# Patient Record
Sex: Female | Born: 1980 | Race: White | Hispanic: No | Marital: Married | Smoking: Never smoker
Health system: Southern US, Community
[De-identification: ages and names within clinical notes are randomized; demographics above are authoritative.]

## PROBLEM LIST (undated history)

## (undated) DIAGNOSIS — J302 Other seasonal allergic rhinitis: Secondary | ICD-10-CM

## (undated) DIAGNOSIS — J189 Pneumonia, unspecified organism: Secondary | ICD-10-CM

## (undated) DIAGNOSIS — Z91048 Other nonmedicinal substance allergy status: Secondary | ICD-10-CM

## (undated) HISTORY — DX: Other nonmedicinal substance allergy status: Z91.048

## (undated) HISTORY — DX: Pneumonia, unspecified organism: J18.9

## (undated) HISTORY — DX: Other seasonal allergic rhinitis: J30.2

---
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
## 2001-05-09 ENCOUNTER — Encounter: Admission: RE | Admit: 2001-05-09 | Discharge: 2001-05-09 | Payer: Self-pay | Admitting: Family Medicine

## 2001-05-09 ENCOUNTER — Encounter: Payer: Self-pay | Admitting: Family Medicine

## 2003-04-25 ENCOUNTER — Encounter: Admission: RE | Admit: 2003-04-25 | Discharge: 2003-04-25 | Payer: Self-pay | Admitting: Family Medicine

## 2003-04-25 ENCOUNTER — Encounter: Payer: Self-pay | Admitting: Family Medicine

## 2003-10-17 ENCOUNTER — Ambulatory Visit (HOSPITAL_COMMUNITY): Admission: RE | Admit: 2003-10-17 | Discharge: 2003-10-17 | Payer: Self-pay | Admitting: Obstetrics and Gynecology

## 2003-11-20 ENCOUNTER — Ambulatory Visit (HOSPITAL_COMMUNITY): Admission: RE | Admit: 2003-11-20 | Discharge: 2003-11-20 | Payer: Self-pay | Admitting: Obstetrics and Gynecology

## 2004-09-15 ENCOUNTER — Encounter (INDEPENDENT_AMBULATORY_CARE_PROVIDER_SITE_OTHER): Payer: Self-pay | Admitting: Specialist

## 2004-09-15 ENCOUNTER — Inpatient Hospital Stay (HOSPITAL_COMMUNITY): Admission: AD | Admit: 2004-09-15 | Discharge: 2004-09-17 | Payer: Self-pay | Admitting: Obstetrics and Gynecology

## 2006-11-05 ENCOUNTER — Inpatient Hospital Stay (HOSPITAL_COMMUNITY): Admission: AD | Admit: 2006-11-05 | Discharge: 2006-11-05 | Payer: Self-pay | Admitting: Obstetrics and Gynecology

## 2007-05-16 ENCOUNTER — Inpatient Hospital Stay (HOSPITAL_COMMUNITY): Admission: AD | Admit: 2007-05-16 | Discharge: 2007-05-18 | Payer: Self-pay | Admitting: Obstetrics and Gynecology

## 2007-12-14 ENCOUNTER — Encounter: Admission: RE | Admit: 2007-12-14 | Discharge: 2007-12-14 | Payer: Self-pay | Admitting: Gastroenterology

## 2010-12-08 NOTE — Discharge Summary (Signed)
NAMEDONNAMAE, MUILENBURG           ACCOUNT NO.:  [id number]   MEDICAL RECORD NO.:  [id number]          PATIENT TYPE:  INP   LOCATION:  9126                          FACILITY:  [facility]   PHYSICIAN:  Malachi Pro. Ambrose Mantle, M.D. DATE OF BIRTH:  03-Jun-1981   DATE OF ADMISSION:  05/16/2007  DATE OF DISCHARGE:  05/18/2007                               DISCHARGE SUMMARY   HOSPITAL COURSE:  A 30 year old, white, married female, P1-0-1-1, G51,  [facility] May 27, 2007, by 9-week ultrasound presented to labor and  delivery with complaints of contractions every 5 minutes with the cervix  4+ centimeters dilated.  The patient was uncomfortable with  contractions, so she was admitted and received an epidural.  There was  no leakage of fluid.   PRENATAL COURSE:  Prenatal care was uncomplicated except for  subchorionic hemorrhage in the first trimester.   PRENATAL LABORATORY DATA:  Blood group and type O positive, negative  antibody, RPR nonreactive, rubella immune, hepatitis B surface antigen  negative, HIV negative, GC and chlamydia negative.  Group B  Streptococcus negative.  One-hour Glucola was 107.   PAST OBSTETRIC HISTORY:  In 2004, she had a spontaneous abortion.  In  2006, a normal spontaneous delivery of a 7 pound 14 ounce infant.   ALLERGIES:  VICODIN.   MEDICATIONS:  Prenatal vitamins.   PAST GYNECOLOGICAL HISTORY:  No abnormal Pap smears.   PAST SURGICAL HISTORY:  1. Tonsillectomy.  2. Wisdom teeth extraction.   PAST MEDICAL HISTORY:  Negative.   PHYSICAL EXAMINATION:  VITAL SIGNS:  Afebrile with normal vital signs.  HEART/LUNGS:  Normal.  ABDOMEN:  Soft and nontender.  Estimated fetal weight was 8 to 8-1/2  pounds.  PELVIC:  Cervix was 4-5 cm, 50-60% effaced, vertex at a -2.   HOSPITAL COURSE:  Dr. Senaida Ores performed artificial rupture of  membranes.  There was some trouble tracing the contractions, so she  placed an intrauterine pressure catheter.  At 8:35 a.m., she was 5 cm,  70%.  By 10:30 a.m., she was 6 cm, 60%.  She progressed to complete  dilatation and pushed well and had a spontaneous vaginal delivery of a  living female infant, 8 pounds 1 ounce, with Apgar's of 8 and 9 at one  and five minutes.  There was a nuchal cord x1 and it was reduced.  There  was also a true knot in the cord.  Placenta was spontaneous and intact.  A first-degree laceration was repaired with 3-0 Vicryl by Dr. Jackelyn Knife.  Small labial abrasions were hemostatic and not repaired.  Blood loss was  less than 500 mL.  Postpartum, the patient did well and was discharged  on the second postpartum day.  RPR was nonreactive.  Hemoglobin on  admission was 12.8, hematocrit 37, white count 10,100, platelet count  216,000.   DISCHARGE DIAGNOSIS:  Intrauterine pregnancy at 38 plus weeks, delivered  vertex.   PROCEDURE:  Spontaneous delivery, vertex with repair of first-degree  laceration.   CONDITION ON DISCHARGE:  Improved.   SPECIAL INSTRUCTIONS:  Instructions include our regular discharge  instruction booklet.   FOLLOW UP:  The  patient is advised to return to the office in 6 weeks  for followup examination.   DISCHARGE MEDICATION:  Darvocet-N 100 20 tablets one every 4-6 hours as  needed for pain is given at discharge.      Malachi Pro. Ambrose Mantle, M.D.  Electronically Signed     TFH/MEDQ  D:  05/18/2007  T:  05/19/2007  Job:  [id number]

## 2010-12-11 NOTE — Discharge Summary (Signed)
Susan Brock, Susan Brock           ACCOUNT NO.:  [id number]   MEDICAL RECORD NO.:  [id number]          PATIENT TYPE:  INP   LOCATION:  9127                          FACILITY:  [facility]   PHYSICIAN:  Malachi Pro. Ambrose Mantle, M.D. DATE OF BIRTH:  September 19, 1980   DATE OF ADMISSION:  09/15/2004  DATE OF DISCHARGE:                                 DISCHARGE SUMMARY   This 30 year old white married female, para 0-0-1-0, gravida 2, ECD/September 21, 2004, admitted for induction of labor.  Blood group and type O positive  with a negative antibody, nonreactive serology, rubella immune, hepatitis B  surface antigen negative, HIV declined.  GC and chlamydia negative.  Triple  screen declined.  One-hour Glucola 109, group B strep negative.   Vaginal ultrasound on February 16, 2004 showed a crown/rump length of 2 cm, 8  weeks 4 days, EDC/September 20, 2004.  Repeat ultrasound on April 22, 2004.  Average gestational age [redacted] weeks and 3 days, EDC/September 20, 2004.  The patient was treated for sinusitis on June 08, 2004 with  Augmentin.  Prenatal care otherwise was uncomplicated.  She was admitted for induction  of labor.   PAST MEDICAL HISTORY:  No known drug allergies.  No illnesses.   OPERATIONS:  T&A in 1989.  Wisdom teeth extracted in 1998.  No close family  history of significance except bother had closure of fontanel at birth and  had to be re-opened.   OBSTETRIC HISTORY:  In 2004, spontaneous abortion.   PHYSICAL EXAMINATION:  Physical exam on admission revealed normal vital  signs.  Heart and lungs were normal.  Abdomen soft.  Fundal height had been  37 cm on September 08, 2004.  Cervix was loose fingertip, 25% vertex at a -2  to -3.  Artificial rupture of the membranes produced clear fluid.  Pitocin  was begun.  By 12:55 p.m., the Pitocin was at 15 milliunits a minute, and  contractions were every two to three minutes.  Cervix was 3 cm, 60%.  At  5:15 p.m, Pitocin was at 18 milliunits a minute,  contractions every two to  four minutes.  The patient had received an epidural, and her cervix was 4+  cm, 90% vertex at a -1.  There was bloody show.  She progressed to full  dilatation and pushed well.  She delivered spontaneously OA over a midline  vaginal and bilateral labial lacerations by Dr. Ambrose Mantle, a living, female  infant 7 pounds 14 ounces.  Apgars of 8 at 1 and 9 at 5 minutes.  There was  one loop of nuchal cord.  The baby's temperature was greater than 101.  Placenta was intact.  The rectal was negative.  Vaginal laceration repaired  with 2-0 Vicryl.  Exposure was somewhat difficult because the perineum was  intact.  Right labial laceration repaired with 3-0 Vicryl.  Left labial  laceration hemostatic and not repaired.  Suturing of the right labial  laceration caused slight distortion.  The labia was torn away from the  clitoris.  Blood loss about 400 mL.  Postpartum, the patient did well and  was ready for  discharge on the second postpartum day.   LABORATORY DATA:  Initial hemoglobin 12.8, hematocrit 37.3, white count  9800.  Followup hemoglobin 12.1, hematocrit 35.6, white count 17,800.  Platelet count 206,000.  RPR nonreactive.   FINAL DIAGNOSES:  Intrauterine pregnancy at 39 weeks, delivered OA.   OPERATION:  Spontaneous delivery OA.  Repair of midline vaginal and right  labial laceration.   FINAL CONDITION:  Improved.   INSTRUCTIONS:  Regular discharge instruction booklet.  Darvocet-N 100 24  tablets, 1 every 4-6 hours as needed for pain.  The patient is advised to  return in six weeks for followup examination.      TFH/MEDQ  D:  09/17/2004  T:  09/17/2004  Job:  [id number]

## 2011-05-05 LAB — CBC
HCT: 37
MCHC: 34.7
MCV: 86.1
RBC: 4.29
WBC: 10.1

## 2012-07-26 DIAGNOSIS — J189 Pneumonia, unspecified organism: Secondary | ICD-10-CM

## 2012-07-26 HISTORY — DX: Pneumonia, unspecified organism: J18.9

## 2014-08-08 ENCOUNTER — Other Ambulatory Visit (INDEPENDENT_AMBULATORY_CARE_PROVIDER_SITE_OTHER): Payer: 59

## 2014-08-08 ENCOUNTER — Encounter: Payer: Self-pay | Admitting: Internal Medicine

## 2014-08-08 ENCOUNTER — Ambulatory Visit (INDEPENDENT_AMBULATORY_CARE_PROVIDER_SITE_OTHER): Payer: 59 | Admitting: Internal Medicine

## 2014-08-08 ENCOUNTER — Ambulatory Visit (INDEPENDENT_AMBULATORY_CARE_PROVIDER_SITE_OTHER)
Admission: RE | Admit: 2014-08-08 | Discharge: 2014-08-08 | Disposition: A | Discharge status: Home / Self Care | Payer: 59 | Source: Ambulatory Visit | Attending: Internal Medicine | Admitting: Internal Medicine

## 2014-08-08 VITALS — BP 122/86 | HR 92 | Temp 98.6°F | Ht 63.0 in | Wt 227.8 lb

## 2014-08-08 DIAGNOSIS — R058 Other specified cough: Secondary | ICD-10-CM

## 2014-08-08 DIAGNOSIS — R05 Cough: Secondary | ICD-10-CM

## 2014-08-08 DIAGNOSIS — J45991 Cough variant asthma: Secondary | ICD-10-CM | POA: Insufficient documentation

## 2014-08-08 DIAGNOSIS — J189 Pneumonia, unspecified organism: Secondary | ICD-10-CM

## 2014-08-08 LAB — CBC WITH DIFFERENTIAL/PLATELET
BASOS PCT: 0.4 % (ref 0.0–3.0)
Basophils Absolute: 0 10*3/uL (ref 0.0–0.1)
EOS ABS: 0.1 10*3/uL (ref 0.0–0.7)
Eosinophils Relative: 0.7 % (ref 0.0–5.0)
HCT: 43.9 % (ref 36.0–46.0)
Hemoglobin: 14.6 g/dL (ref 12.0–15.0)
LYMPHS PCT: 22.2 % (ref 12.0–46.0)
Lymphs Abs: 2.4 10*3/uL (ref 0.7–4.0)
MCHC: 33.3 g/dL (ref 30.0–36.0)
MCV: 86.4 fl (ref 78.0–100.0)
Monocytes Absolute: 0.4 10*3/uL (ref 0.1–1.0)
Monocytes Relative: 3.6 % (ref 3.0–12.0)
NEUTROS ABS: 7.8 10*3/uL — AB (ref 1.4–7.7)
Neutrophils Relative %: 73.1 % (ref 43.0–77.0)
PLATELETS: 280 10*3/uL (ref 150.0–400.0)
RBC: 5.08 Mil/uL (ref 3.87–5.11)
RDW: 14.2 % (ref 11.5–15.5)
WBC: 10.7 10*3/uL — AB (ref 4.0–10.5)

## 2014-08-08 MED ORDER — PREDNISONE 10 MG PO TABS: ORAL_TABLET | ORAL | Status: DC

## 2014-08-08 MED ORDER — PANTOPRAZOLE SODIUM 40 MG PO TBEC: 40.0000 mg | DELAYED_RELEASE_TABLET | Freq: Every day | ORAL | Status: DC

## 2014-08-08 MED ORDER — TRAMADOL HCL 50 MG PO TABS: ORAL_TABLET | ORAL | Status: DC

## 2014-08-08 MED ORDER — FAMOTIDINE 20 MG PO TABS: ORAL_TABLET | ORAL | Status: DC

## 2014-08-08 NOTE — Assessment & Plan Note (Signed)
The most common causes of chronic cough in immunocompetent adults include the following: upper airway cough syndrome (UACS), previously referred to as postnasal drip syndrome (PNDS), which is caused by variety of rhinosinus conditions; (2) asthma; (3) GERD; (4) chronic bronchitis from cigarette smoking or other inhaled environmental irritants; (5) nonasthmatic eosinophilic bronchitis; and (6) bronchiectasis.   These conditions, singly or in combination, have accounted for up to 94% of the causes of chronic cough in prospective studies.   Other conditions have constituted no >6% of the causes in prospective studies These have included bronchogenic carcinoma, chronic interstitial pneumonia, sarcoidosis, left ventricular failure, ACEI-induced cough, and aspiration from a condition associated with pharyngeal dysfunction.    Chronic cough is often simultaneously caused by more than one condition. A single cause has been found from 38 to 82% of the time, multiple causes from 18 to 62%. Multiply caused cough has been the result of three diseases up to 42% of the time.       Based on hx and exam, this is most likely:  Classic Upper airway cough syndrome, so named because it's frequently impossible to sort out how much is  CR/sinusitis with freq throat clearing (which can be related to primary GERD)   vs  causing  secondary (" extra esophageal")  GERD from wide swings in gastric pressure that occur with throat clearing, often  promoting self use of mint and menthol lozenges that reduce the lower esophageal sphincter tone and exacerbate the problem further in a cyclical fashion.   These are the same pts (now being labeled as having "irritable larynx syndrome" by some cough centers) who not infrequently have a history of having failed to tolerate ace inhibitors,  dry powder inhalers or biphosphonates or report having atypical reflux symptoms that don't respond to standard doses of PPI , and are easily confused as  having aecopd or asthma flares by even experienced allergists/ pulmonologists.   The first step is to maximize acid suppression and eliminate cyclical coughing then regroup if the cough persists.  See instructions for specific recommendations which were reviewed directly with the patient who was given a copy with highlighter outlining the key components.    Will rx as sinusitis/gerd then regroup in 2 weeks

## 2014-08-08 NOTE — Assessment & Plan Note (Signed)
Not sure this is really cap or ? Could she be developing the equivalent of a RML syndrome on the Left or atypical TB / bronchiectasis involving lingula   rx with augmentin as may also have sinus dz and f/u in 2 weeks

## 2014-08-08 NOTE — Patient Instructions (Addendum)
Augmentin 875 mg take one pill twice daily  X 10 days - take at breakfast and supper with large glass of water.  It would help reduce the usual side effects (diarrhea and yeast infections) if you ate cultured yogurt at lunch.   Please see patient coordinator before you leave today  to schedule sinus ct  The key to effective treatment for your cough is eliminating the non-stop cycle of cough you're stuck in long enough to let your airway heal completely and then see if there is anything still making you cough once you stop the cough suppression, but this should take no more than 5 days to figure out  First take delsym two tsp every 12 hours and supplement if needed with  tramadol 50 mg up to 2 every 4 hours to suppress the urge to cough at all or even clear your throat. Swallowing water or using ice chips/non mint and menthol containing candies (such as lifesavers or sugarless jolly ranchers) are also effective.  You should rest your voice and avoid activities that you know make you cough.  Once you have eliminated the cough for 3 straight days try reducing the tramadol first,  then the delsym as tolerated.    Prednisone 10 mg take  4 each am x 2 days,   2 each am x 2 days,  1 each am x 2 days and stop (this is to eliminate allergies and inflammation from coughing)  Protonix (pantoprazole) Take 30-60 min before first meal of the day and Pepcid 20 mg one bedtime plus chlorpheniramine 4 mg x 2 at bedtime (both available over the counter)  until cough is completely gone for at least a week without the need for cough suppression  GERD (REFLUX)  is an extremely common cause of respiratory symptoms, many times with no significant heartburn at all.    It can be treated with medication, but also with lifestyle changes including avoidance of late meals, excessive alcohol, smoking cessation, and avoid fatty foods, chocolate, peppermint, colas, red wine, and acidic juices such as orange juice.  NO MINT OR  MENTHOL PRODUCTS SO NO COUGH DROPS  USE HARD CANDY INSTEAD (jolley ranchers or Stover's or Lifesavers (all available in sugarless versions) NO OIL BASED VITAMINS - use powdered substitutes.  Please remember to go to the lab and x-ray department downstairs for your tests - we will call you with the results when they are available.     If not better next week call for follow up appt Late add f/u 2 weeks with cxr

## 2014-08-08 NOTE — Progress Notes (Signed)
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 Pulmonary Consult    Referred by Dr. Josie SaundersAsres. Pt c/o cough for the past 2 months- prod at times with yellow to green sputum. She also c/o DOE with walking up stairs. She had PNA Nov 2014.   Multiple abx and steroids since abrupt  onset 2 x months never improved but  better while sleeping then after stirs in am much worse within 30 min regardless of activity  Assoc with urinary incont, sob mostly with cough  No obvious other patterns in day to day or daytime variabilty or assoc  cp or chest tightness, subjective wheeze   hb symptoms. No unusual exp hx or h/o childhood pna/ asthma or knowledge of premature birth.  Sleeping ok without nocturnal  or early am exacerbation  of respiratory  c/o's or need for noct saba. Also denies any obvious fluctuation of symptoms with weather or environmental changes or other aggravating or alleviating factors except as outlined above   Current Medications, Allergies, Complete Past Medical History, Past Surgical History, Family History, and Social History were reviewed in [facility] electronic medical record.             Review of Systems  Constitutional: Negative for fever, chills and unexpected weight change.  HENT: Positive for congestion and ear pain. Negative for dental problem, nosebleeds, postnasal drip, rhinorrhea, sinus pressure, sneezing, sore throat, trouble swallowing and voice change.   Eyes: Negative for visual disturbance.    Respiratory: Positive for cough and shortness of breath. Negative for choking.   Cardiovascular: Negative for chest pain and leg swelling.  Gastrointestinal: Negative for vomiting, abdominal pain and diarrhea.  Genitourinary: Negative for difficulty urinating.  Musculoskeletal: Negative for arthralgias.  Skin: Negative for rash.  Neurological: Negative for tremors, syncope and headaches.  Hematological: Does not bruise/bleed easily.       Objective:   Physical Exam amb wf honking /junky cough  Wt Readings from Last 3 Encounters:  08/08/14 227 lb 12.8 oz (103.329 kg)    Vital signs reviewed  HEENT: nl dentition, turbinates, and orophanx. Nl external ear canals without cough reflex   NECK :  without JVD/Nodes/TM/ nl carotid upstrokes bilaterally   LUNGS: no acc muscle use, clear to A and P bilaterally without cough on exp maneuvers   CV:  RRR  no s3 or murmur or increase in P2, no edema   ABD:  soft and nontender with nl excursion in the supine position. No bruits or organomegaly, bowel sounds nl  MS:  warm without deformities, calf tenderness, cyanosis or clubbing  SKIN: warm and dry without lesions    NEURO:  alert, approp, no deficits    CXR PA and Lateral:   08/08/2014 :     I personally reviewed images and agree (though the finding is very unimpressive to me)  with radiology impression as follows:   Patchy increased density in the lingula is consistent with  atelectasis or early interstitial infiltrate. Follow-up imaging following anticipated antibiotic therapy is recommended to assure Clearing.  Labs ordered Allergy profile  No eos Lab Results  Component Value Date   WBC 10.7* 08/08/2014   HGB 14.6 08/08/2014   HCT 43.9 08/08/2014   MCV 86.4 08/08/2014   PLT 280.0 08/08/2014            Assessment & Plan:

## 2014-08-09 ENCOUNTER — Telehealth: Payer: Self-pay | Admitting: Internal Medicine

## 2014-08-09 LAB — ALLERGY FULL PROFILE
Alternaria Alternata: <0.1 kU/L
Aspergillus fumigatus, m3: <0.1 kU/L
Bahia Grass: <0.1 kU/L
Box Elder IgE: <0.1 kU/L
Cat Dander: <0.1 kU/L
Elm IgE: <0.1 kU/L
Fescue: <0.1 kU/L
G005 Rye, Perennial: <0.1 kU/L
G009 Red Top: <0.1 kU/L
Goldenrod: <0.1 kU/L
House Dust Hollister: <0.1 kU/L
IGE (IMMUNOGLOBULIN E), SERUM: 33 kU/L (ref <115)
Plantain: <0.1 kU/L
Stemphylium Botryosum: <0.1 kU/L

## 2014-08-09 NOTE — Telephone Encounter (Signed)
Pt called back and is aware that [facility]. Nothing more needed at this time.

## 2014-08-09 NOTE — Progress Notes (Signed)
Quick Note:  Spoke with pt and notified of results per Dr. Wert. Pt verbalized understanding and denied any questions.  ______ 

## 2014-08-09 NOTE — Telephone Encounter (Signed)
Rx called to pharm for augmentin  ATC the pt, NA and no option to leave msg

## 2014-08-15 ENCOUNTER — Ambulatory Visit (INDEPENDENT_AMBULATORY_CARE_PROVIDER_SITE_OTHER)
Admission: RE | Admit: 2014-08-15 | Discharge: 2014-08-15 | Disposition: A | Discharge status: Home / Self Care | Payer: 59 | Source: Ambulatory Visit | Attending: Internal Medicine | Admitting: Internal Medicine

## 2014-08-15 ENCOUNTER — Other Ambulatory Visit: Payer: Self-pay | Admitting: Internal Medicine

## 2014-08-15 DIAGNOSIS — R058 Other specified cough: Secondary | ICD-10-CM

## 2014-08-15 DIAGNOSIS — R05 Cough: Secondary | ICD-10-CM

## 2014-08-15 MED ORDER — AMOXICILLIN-POT CLAVULANATE 875-125 MG PO TABS: 1.0000 | ORAL_TABLET | Freq: Two times a day (BID) | ORAL | Status: DC

## 2014-08-15 NOTE — Progress Notes (Signed)
Quick Note:  Spoke with pt and notified of results per Dr. Wert. Pt verbalized understanding and denied any questions.  ______ 

## 2014-08-23 ENCOUNTER — Ambulatory Visit (INDEPENDENT_AMBULATORY_CARE_PROVIDER_SITE_OTHER): Payer: 59 | Admitting: Internal Medicine

## 2014-08-23 ENCOUNTER — Encounter: Payer: Self-pay | Admitting: Internal Medicine

## 2014-08-23 ENCOUNTER — Ambulatory Visit (INDEPENDENT_AMBULATORY_CARE_PROVIDER_SITE_OTHER)
Admission: RE | Admit: 2014-08-23 | Discharge: 2014-08-23 | Disposition: A | Discharge status: Home / Self Care | Payer: 59 | Source: Ambulatory Visit | Attending: Internal Medicine | Admitting: Internal Medicine

## 2014-08-23 VITALS — BP 114/80 | HR 86 | Temp 98.1°F | Ht 63.0 in | Wt 226.0 lb

## 2014-08-23 DIAGNOSIS — J189 Pneumonia, unspecified organism: Secondary | ICD-10-CM

## 2014-08-23 DIAGNOSIS — Z720 Tobacco use: Secondary | ICD-10-CM

## 2014-08-23 DIAGNOSIS — R058 Other specified cough: Secondary | ICD-10-CM

## 2014-08-23 DIAGNOSIS — F17210 Nicotine dependence, cigarettes, uncomplicated: Secondary | ICD-10-CM

## 2014-08-23 DIAGNOSIS — R05 Cough: Secondary | ICD-10-CM

## 2014-08-23 MED ORDER — FLUTTER DEVI: Status: AC

## 2014-08-23 MED ORDER — MOMETASONE FURO-FORMOTEROL FUM 100-5 MCG/ACT IN AERO: INHALATION_SPRAY | RESPIRATORY_TRACT | Status: DC

## 2014-08-23 NOTE — Progress Notes (Signed)
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 Pulmonary Consult    Referred by Dr. Josie SaundersAsres. Pt c/o cough for the past 2 months- prod at times with yellow to green sputum. She also c/o DOE with walking up stairs. She had PNA Nov 2014.   Multiple abx and steroids since abrupt  onset 2 x months never improved but  better while sleeping then after stirs in am much worse within 30 min regardless of activity  Assoc with urinary incont, sob mostly with cough rec Augmentin 875 mg take one pill twice daily  X 10 days - take at breakfast and supper with large glass of water.  It would help reduce the usual side effects (diarrhea and yeast infections) if you ate cultured yogurt at lunch.  Please see patient coordinator before you leave today  to schedule sinus ct> sphenoid sinusitis/ bubbly secrections > extended augmentinto 20 days rx First take delsym two tsp every 12 hours and supplement if needed with  tramadol 50 mg up to 2 every 4 hours     Prednisone 10 mg take  4 each am x 2 days,   2 each am x 2 days,  1 each am x 2 days and stop (this is to eliminate allergies and inflammation from coughing) Protonix (pantoprazole) Take 30-60 min before first meal of the day and Pepcid 20 mg one bedtime plus chlorpheniramine 4 mg x 2 at bedtime (both available over the counter)  until cough is completely gone for at least a week without the need for cough suppression GERD ( diet   08/23/2014 f/u  ov/Alany Borman re:  Chief Complaint  Patient presents with  . Follow-up    CXR done today. Cough has improved some. Still has some occ light green sputum. Her chest still feels tight at times and breathing not completely back to baseline yet. She has not used rescue inhaler in the past wk.    Most green mucus is in am/ has one more week of augmentin  Not using ventolin this week   No obvious day to day or daytime variabilty or assoc sob  or cp or chest tightness, subjective wheeze overt sinus or hb symptoms. No unusual exp hx or h/o childhood pna/ asthma or knowledge of premature birth.  Sleeping ok without nocturnal  or early am exacerbation  of respiratory  c/o's or need for noct saba. Also denies any obvious fluctuation of symptoms with weather or environmental changes or other aggravating or alleviating factors except as outlined above   Current Medications, Allergies, Complete Past Medical History, Past Surgical History, Family History, and Social History were reviewed in [facility] electronic medical record.  ROS  The following are not active complaints unless bolded sore throat, dysphagia, dental problems, itching, sneezing,  nasal congestion or excess/ purulent secretions, ear ache,   fever, chills, sweats, unintended wt loss, pleuritic or exertional cp,  hemoptysis,  orthopnea pnd or leg swelling, presyncope, palpitations, heartburn, abdominal pain, anorexia, nausea, vomiting, diarrhea  or change in bowel or urinary habits, change in stools or urine, dysuria,hematuria,  rash, arthralgias, visual complaints, headache, numbness weakness or ataxia or problems with walking or coordination,  change in mood/affect or memory.                         Objective:   Physical Exam  amb wf with grinding end exp cough but not longer honking    Vital signs reviewed  HEENT: nl dentition, turbinates, and orophanx. Nl external ear canals without cough reflex   NECK :  without JVD/Nodes/TM/ nl  carotid upstrokes bilaterally   LUNGS: no acc muscle use, clear to A and P bilaterally without cough on exp maneuvers   CV:  RRR  no s3 or murmur or increase in P2, no edema   ABD:  soft and nontender with nl excursion in the supine position. No bruits or organomegaly, bowel sounds nl  MS:  warm without deformities, calf tenderness, cyanosis or clubbing  SKIN: warm and dry without lesions    NEURO:  alert, approp, no deficits     Labs ordered Allergy profile  No eos Lab Results  Component Value Date   WBC 10.7* 08/08/2014   HGB 14.6 08/08/2014   HCT 43.9 08/08/2014   MCV 86.4 08/08/2014   PLT 280.0 08/08/2014     CXR:  08/23/14 I personally reviewed images and agree with radiology impression as follows:   There is stable mildly increased density in the lingula which may reflect persistent infiltrate, atelectasis, or scarring.     Assessment & Plan:

## 2014-08-23 NOTE — Patient Instructions (Addendum)
dulera 100 Take 2 puffs first thing in am and then another 2 puffs about 12 hours later.   Only use your albuterol as a rescue medication to be used if you can't catch your breath by resting or doing a relaxed purse lip breathing pattern.  - The less you use it, the better it will work when you need it. - Ok to use up to 2 puffs  every 4 hours if you must but call for immediate appointment if use goes up over your usual need - Don't leave home without it !!  (think of it like the spare tire for your car)   Finish antibiotics   Check with your insurance formulary re best option for protonix alternative   Use the flutter valve as much as you can   Please schedule a follow up office visit in 4 weeks, sooner if needed Late add cxr on return

## 2014-08-25 ENCOUNTER — Encounter: Payer: Self-pay | Admitting: Internal Medicine

## 2014-08-25 NOTE — Assessment & Plan Note (Signed)
?   Developing lingula syndrome/ bronchiectasis on L > recheck cxr in 4 weeks then CT next

## 2014-08-25 NOTE — Assessment & Plan Note (Signed)
.>  3 mi  I took an extended  opportunity with this patient to outline the consequences of continued cigarette use  in airway disorders based on all the data we have from the multiple national lung health studies (perfomed over decades at millions of dollars in cost)  indicating that smoking cessation, not choice of inhalers or physicians, is the most important aspect of care.    Not ready to commit to quit yet

## 2014-08-25 NOTE — Assessment & Plan Note (Signed)
Still has very harsh/ grinding pattern cough typical of an upper airway source which is highly dysfunctional/ traumatizing to the airway  rec Add the flutter valve Stop smoking Finish abx  May need ct sinus and chest if still coughing

## 2014-09-03 ENCOUNTER — Telehealth: Payer: Self-pay | Admitting: Internal Medicine

## 2014-09-03 NOTE — Telephone Encounter (Signed)
Spoke with pt, states she finished Augmentin last Friday, woke up yesterday with sore throat, pnd.  Jamal Maesried to use a humidifier last night, did not help.  Woke up this morning with the same symptoms.  Requesting recs.  Uses [facility] in [place].    MW please advise.  Thank you.

## 2014-09-03 NOTE — Telephone Encounter (Signed)
Be sure still taking ppi qam ac and pepcid 20 qhs and   For drainage take chlortrimeton (chlorpheniramine) 4 mg every 4 hours available over the counter (may cause drowsiness) and if not better will need to repeat sinus CT by end of week rather than just keep piling on abx as risk resistant bugs/ c diff etc

## 2014-09-03 NOTE — Telephone Encounter (Signed)
Pt aware of rec's per MW -  Aware to call us and let us know if no better by the end of week.  Nothing further needed.

## 2014-09-09 ENCOUNTER — Telehealth: Payer: Self-pay | Admitting: Internal Medicine

## 2014-09-09 MED ORDER — AMOXICILLIN-POT CLAVULANATE 875-125 MG PO TABS: 1.0000 | ORAL_TABLET | Freq: Two times a day (BID) | ORAL | Status: DC

## 2014-09-09 NOTE — Telephone Encounter (Signed)
Spoke with pt.  She called last wk with c/o PND and sore throat and was given following recs:  Nyoka CowdenMichael B Wert, MD at 09/03/2014 12:28 PM     Status: Signed       Expand All Collapse All   Be sure still taking ppi qam ac and pepcid 20 qhs and For drainage take chlortrimeton (chlorpheniramine) 4 mg every 4 hours available over the counter (may cause drowsiness) and if not better will need to repeat sinus CT by end of week rather than just keep piling on abx as risk resistant bugs/ c diff etc        -----  Pt states she continues to take ppi qam and pepcid 20 mg qhs along with chlortrimeton  4mg  q4h.  Still has sore throat and PND.  Cough started increasing over the wkend.  Occas prod with small amount of green tinted mucus.  Requesting further recs - MW, would you like to proceed with CT Sinus?

## 2014-09-09 NOTE — Telephone Encounter (Signed)
Called, spoke with pt -  Discussed below per MW.  She verbalized understanding and is aware rx [facility] [place]. Pt has appt already scheduled with MW on Feb 29 at 9:15 am.  Pt would like to keep this appt and is to call back if symptoms worsen prior to this.  She verbalized understanding, is in agreement with plan, and voiced no further questions or concerns at this time.

## 2014-09-09 NOTE — Telephone Encounter (Signed)
Let's just do Augmentin 875 mg take one pill twice daily  X 10 days - take at breakfast and supper with large glass of water.  It would help reduce the usual side effects (diarrhea and yeast infections) if you ate cultured yogurt at lunch then return here at end of rx to regroup with all meds in hand

## 2014-09-23 ENCOUNTER — Encounter: Payer: Self-pay | Admitting: Internal Medicine

## 2014-09-23 ENCOUNTER — Ambulatory Visit (INDEPENDENT_AMBULATORY_CARE_PROVIDER_SITE_OTHER): Payer: 59 | Admitting: Internal Medicine

## 2014-09-23 VITALS — BP 122/90 | HR 99 | Ht 63.0 in | Wt 228.0 lb

## 2014-09-23 DIAGNOSIS — R058 Other specified cough: Secondary | ICD-10-CM

## 2014-09-23 DIAGNOSIS — R05 Cough: Secondary | ICD-10-CM

## 2014-09-23 DIAGNOSIS — J45991 Cough variant asthma: Secondary | ICD-10-CM

## 2014-09-23 MED ORDER — MOMETASONE FURO-FORMOTEROL FUM 100-5 MCG/ACT IN AERO: INHALATION_SPRAY | RESPIRATORY_TRACT | Status: DC

## 2014-09-23 NOTE — Patient Instructions (Addendum)
Please see patient coordinator before you leave today  to schedule sinus CT and we will arrange referral to Dr Jenne PaneBates if needed  Continue PPI in the form of prilosec otc 20 mg Take 30- 60 min before your first and last meals of the day until 100% better, then try   Continue dulera 100 Take 2 puffs first thing in am and then another 2 puffs about 12 hours later.   Please schedule a follow up office visit in 6 weeks, call sooner if needed  Late add needs f/u cxr re lingular infiltrate on return

## 2014-09-23 NOTE — Progress Notes (Signed)
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 Pulmonary Consult    Referred by Dr. Josie Saunders. Pt c/o cough for the past 2 months- prod at times with yellow to green sputum. She also c/o DOE with walking up stairs. She had PNA Nov 2014.   Multiple abx and steroids since abrupt  onset 2 x months never improved but  better while sleeping then after stirs in am much worse within 30 min regardless of activity  Assoc with urinary incont, sob mostly with cough rec Augmentin 875 mg take one pill twice daily  X 10 days - take at breakfast and supper with large glass of water.  It would help reduce the usual side effects (diarrhea and yeast infections) if you ate cultured yogurt at lunch.  Please see patient coordinator before you leave today  to schedule sinus ct> sphenoid sinusitis/ bubbly secrections > extended augmentinto 20 days rx First take delsym two tsp every 12 hours and supplement if needed with  tramadol 50 mg up to 2 every 4 hours     Prednisone 10 mg take  4 each am x 2 days,   2 each am x 2 days,  1 each am x 2 days and stop (this is to eliminate allergies and inflammation from coughing) Protonix (pantoprazole) Take 30-60 min before first meal of the day and Pepcid 20 mg one bedtime plus chlorpheniramine 4 mg x 2 at bedtime (both available over the counter)  until cough is completely gone for at least a week without the need for cough suppression GERD diet   08/23/2014 f/u  ov/Susan Brock re:  Chief Complaint  Patient presents with  . Follow-up    CXR done today. Cough has improved some. Still has some occ light green sputum. Her chest still feels tight at times and breathing not completely back to baseline yet. She has not used rescue inhaler in the past wk.   Most green mucus is in am/ has one more week of augmentin  Not using ventolin this week  rec dulera 100 Take 2 puffs first thing in am and then another 2 puffs about 12 hours later.  Only use your albuterol as a rescue medication Finish antibiotics  Check with your insurance formulary re best option for protonix alternative  Use the flutter valve as much as you can        09/23/2014 f/u ov/Susan Brock re: cough since Nov 2015 ? Cough variant asthma? Vs uacs  Chief Complaint  Patient presents with  . Follow-up    Pt states her cough is some better. Chest does not feel tight anymore. She has not had to use rescue inhaler since her last visit. No new co's today.   cough was green now minimal yellow, some p stir p stirs in am  Stopped singulair on her own x 3 weeks as did not think it helped  [employer]  100 2bid   No obvious day to day or daytime variabilty or assoc sob  or cp or chest tightness, subjective wheeze overt sinus or hb symptoms. No unusual exp hx or h/o childhood pna/ asthma or knowledge of premature birth.  Sleeping ok without nocturnal  or early am exacerbation  of respiratory  c/o's or need for noct saba. Also denies any obvious fluctuation of symptoms with weather or environmental changes or other aggravating or alleviating factors except as outlined above   Current Medications, Allergies, Complete Past Medical History, Past Surgical History, Family History, and Social History were reviewed in [facility] electronic medical record.  ROS  The following are not active complaints unless bolded sore throat, dysphagia, dental problems, itching, sneezing,  nasal congestion or excess/ purulent secretions,  ear ache,   fever, chills, sweats, unintended wt loss, pleuritic or exertional cp, hemoptysis,  orthopnea pnd or leg swelling, presyncope, palpitations, heartburn, abdominal pain, anorexia, nausea, vomiting, diarrhea  or change in bowel or urinary habits, change in stools or urine, dysuria,hematuria,  rash, arthralgias, visual complaints, headache, numbness weakness or ataxia or problems with walking or coordination,  change in mood/affect or memory.                         Objective:   Physical Exam  amb wf nad   Wt Readings from Last 3 Encounters:  09/23/14 228 lb (103.42 kg)  08/23/14 226 lb (102.513 kg)  08/08/14 227 lb 12.8 oz (103.329 kg)    Vital signs reviewed       HEENT: nl dentition, turbinates, and orophanx. Nl external ear canals without cough reflex   NECK :  without JVD/Nodes/TM/ nl carotid upstrokes bilaterally   LUNGS: no acc muscle use, clear to A and P bilaterally without no longer cough on exp maneuvers   CV:  RRR  no s3 or murmur or increase in P2, no edema   ABD:  soft and nontender with nl excursion in the supine position. No bruits or organomegaly, bowel sounds nl  MS:  warm without deformities, calf tenderness, cyanosis or clubbing  SKIN: warm and dry without lesions    NEURO:  alert, approp, no deficits     Labs ordered Allergy profile  No eos Lab Results  Component Value Date   WBC 10.7* 08/08/2014   HGB 14.6 08/08/2014   HCT 43.9 08/08/2014   MCV 86.4 08/08/2014   PLT 280.0 08/08/2014     CXR:  08/23/14 I personally reviewed images and agree with radiology impression as follows:   There is stable mildly increased density in the lingula which may reflect persistent infiltrate, atelectasis, or scarring.     Assessment & Plan:

## 2014-09-24 ENCOUNTER — Encounter: Payer: Self-pay | Admitting: Internal Medicine

## 2014-09-24 ENCOUNTER — Telehealth: Payer: Self-pay | Admitting: Internal Medicine

## 2014-09-24 ENCOUNTER — Ambulatory Visit (INDEPENDENT_AMBULATORY_CARE_PROVIDER_SITE_OTHER)
Admission: RE | Admit: 2014-09-24 | Discharge: 2014-09-24 | Disposition: A | Discharge status: Home / Self Care | Payer: 59 | Source: Ambulatory Visit | Attending: Internal Medicine | Admitting: Internal Medicine

## 2014-09-24 DIAGNOSIS — R05 Cough: Secondary | ICD-10-CM

## 2014-09-24 DIAGNOSIS — R058 Other specified cough: Secondary | ICD-10-CM

## 2014-09-24 NOTE — Progress Notes (Signed)
Quick Note:  LMTCB ______ 

## 2014-09-24 NOTE — Telephone Encounter (Signed)
Notes Recorded by Nyoka CowdenMichael B Wert, MD on 09/24/2014 at 11:40 AM Call patient : Study is unremarkable, no change in recs - she needs a f/u cxr at next ov as radiology was concerned about an area in her lingula but I think this is just scar from prev pna -- Pt is aware of CT results.

## 2014-09-24 NOTE — Assessment & Plan Note (Addendum)
-   allergy profile 08/08/2014 >  IgE 33 with neg RAST,  Eos 0.1 - sinus CT 08/15/2014 > bubbly sphenoid sinusitis > rec complete 20 total days augmentin  - added flutter valve 08/23/14 - add dulera 100 08/23/14  - stopped singulair around 09/04/14 no worse  - sinus CT 09/23/2014 >>>     She is clearly improving on dulera 100 2bid so should continue it awaiting repeat sinus ct to be sure that source of cough/wheeze has been eliminated    Each maintenance medication was reviewed in detail including most importantly the difference between maintenance and as needed and under what circumstances the prns are to be used.  Please see instructions for details which were reviewed in writing and the patient given a copy.

## 2014-11-04 ENCOUNTER — Ambulatory Visit (INDEPENDENT_AMBULATORY_CARE_PROVIDER_SITE_OTHER)
Admission: RE | Admit: 2014-11-04 | Discharge: 2014-11-04 | Disposition: A | Discharge status: Home / Self Care | Payer: 59 | Source: Ambulatory Visit | Attending: Internal Medicine | Admitting: Internal Medicine

## 2014-11-04 ENCOUNTER — Ambulatory Visit (INDEPENDENT_AMBULATORY_CARE_PROVIDER_SITE_OTHER): Payer: 59 | Admitting: Internal Medicine

## 2014-11-04 ENCOUNTER — Encounter: Payer: Self-pay | Admitting: Internal Medicine

## 2014-11-04 VITALS — BP 114/80 | HR 99 | Ht 63.0 in | Wt 230.0 lb

## 2014-11-04 DIAGNOSIS — J45991 Cough variant asthma: Secondary | ICD-10-CM | POA: Diagnosis not present

## 2014-11-04 DIAGNOSIS — J189 Pneumonia, unspecified organism: Secondary | ICD-10-CM | POA: Diagnosis not present

## 2014-11-04 NOTE — Progress Notes (Signed)
Quick Note:  Spoke with pt and notified of results per Dr. Wert. Pt verbalized understanding and denied any questions.  ______ 

## 2014-11-04 NOTE — Assessment & Plan Note (Signed)
See cxr 08/08/2014 > completely clear  No further f/u needed

## 2014-11-04 NOTE — Assessment & Plan Note (Signed)
-   allergy profile 08/08/2014 >  IgE 33 with neg RAST,  Eos 0.1 - sinus CT 08/15/2014 > bubbly sphenoid sinusitis > rec complete 20 total days augmentin  - added flutter valve 08/23/14 - add dulera 100 08/23/14  - stopped singulair around 09/04/14 no worse  - sinus CT 09/24/2014 > neg  - stopped dulera end of 09/2014 and well as gerd Rx  I had an extended discussion with the patient reviewing all relevant studies completed to date and  lasting 15 to 20 minutes of a 25 minute visit on the following ongoing concerns:  1) not really able to tell what the primary problem is ? Rhinitis pnds vs cough variant asthma vs gerd promoted by coughing   2) already had prev allergy eval and our wu here unimpressive so best rx is zyrtec plus perhaps a trial of nasal ICS or astelin or both rather than repeat allergy eval of pnds persists on zyrtec  3) restart dulera 100 2bid immediately if cough really flares then add back gerd rx as likely to experience cough induced gerd based on body habitus  4) Each maintenance medication was reviewed in detail including most importantly the difference between maintenance and as needed and under what circumstances the prns are to be used.  Please see instructions for details which were reviewed in writing and the patient given a copy.   5) see discussion re lingular atx  6)  Each maintenance medication was reviewed in detail including most importantly the difference between maintenance and as needed and under what circumstances the prns are to be used.  Please see instructions for details which were reviewed in writing and the patient given a copy.

## 2014-11-04 NOTE — Patient Instructions (Addendum)
For any cough flare 1st retart dulera and if not rapidly improving add the reflux medications back  Continue zyrtec as needed    If you are satisfied with your treatment plan,  let your doctor know and he/she can either refill your medications or you can return here when your prescription runs out.     If in any way you are not 100% satisfied,  please tell us.  If 100% better, tell your friends!  Pulmonary follow up is as needed

## 2014-11-04 NOTE — Progress Notes (Signed)
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 Pulmonary Consult    Referred by Susan. Josie Brock. Pt c/o cough for the past 2 months- prod at times with yellow to green sputum. She also c/o DOE with walking up stairs. She had PNA Nov 2014.   Multiple abx and steroids since abrupt  onset 2 x months never improved but  better while sleeping then after stirs in am much worse within 30 min regardless of activity  Assoc with urinary incont, sob mostly with cough rec Augmentin 875 mg take one pill twice daily  X 10 days - take at breakfast and supper with large glass of water.  It would help reduce the usual side effects (diarrhea and yeast infections) if you ate cultured yogurt at lunch.  Please see patient coordinator before you leave today  to schedule sinus ct> sphenoid sinusitis/ bubbly secrections > extended augmentinto 20 days rx First take delsym two tsp every 12 hours and supplement if needed with  tramadol 50 mg up to 2 every 4 hours     Prednisone 10 mg take  4 each am x 2 days,   2 each am x 2 days,  1 each am x 2 days and stop (this is to eliminate allergies and inflammation from coughing) Protonix (pantoprazole) Take 30-60 min before first meal of the day and Pepcid 20 mg one bedtime plus chlorpheniramine 4 mg x 2 at bedtime (both available over the counter)  until cough is completely gone for at least a week without the need for cough suppression GERD diet   08/23/2014 f/u  ov/Susan Brock re: cough / ? Lingular atx  Chief Complaint  Patient presents with  . Follow-up    CXR done today. Cough has improved some. Still has some occ light green sputum. Her chest still feels tight at times and breathing not completely back to baseline yet. She has not used rescue inhaler in the past wk.   Most green mucus is in am/ has one more week of augmentin  Not using ventolin this week  rec dulera 100 Take 2 puffs first thing in am and then another 2 puffs about 12 hours later.  Only use your albuterol as a rescue medication Finish antibiotics  Check with your insurance formulary re best option for protonix alternative  Use the flutter valve as much as you can        09/23/2014 f/u ov/Susan Brock re: cough since Nov 2015 ? Cough variant asthma? Vs uacs  Chief Complaint  Patient presents with  . Follow-up    Pt states her cough is some better. Chest does not feel tight anymore. She has not had to use rescue inhaler since her last visit. No new co's today.   cough was green now minimal yellow, some p stir p stirs in am  Stopped singulair on her own x 3 weeks as did  not think it helped  dulera 100 2bid  rec Please see patient coordinator before you leave today  to schedule sinus CT and we will arrange referral to Susan Brock if needed Continue PPI in the form of prilosec otc 20 mg Take 30- 60 min before your first and last meals of the day until 100% better, then try  Continue dulera 100 Take 2 puffs first thing in am and then another 2 puffs about 12 hours late    11/04/2014 f/u ov/Susan Brock re: lingular atx /cough variant asthma Chief Complaint  Patient presents with  . Follow-up    Pt states cough was some worse for the past several days- relates to spending time outside. Cough is non prod. She has stopped acid suppresion and no longer taking cough suppressants.   stopped dulera sev weeks prior to OV  And also no longer on gerd rx/ just zyrtec. Minimal Cough day > noct, dry/ no need  For  saba   No obvious day to day or daytime variabilty or assoc sob  or cp or chest tightness, subjective wheeze overt sinus or hb symptoms. No unusual exp hx or h/o childhood pna/ asthma or knowledge of premature birth.  Sleeping ok without nocturnal  or early am exacerbation  of respiratory  c/o's or need for noct saba. Also denies any obvious fluctuation of symptoms with weather or environmental changes or other aggravating or alleviating factors except as outlined above   Current Medications, Allergies, Complete Past Medical History, Past Surgical History, Family History, and Social History were reviewed in [facility] electronic medical record.  ROS  The following are not active complaints unless bolded sore throat, dysphagia, dental problems, itching, sneezing,  nasal congestion or excess/ purulent secretions, ear ache,   fever, chills, sweats, unintended wt loss, pleuritic or exertional cp, hemoptysis,  orthopnea pnd or leg swelling, presyncope, palpitations, heartburn, abdominal pain, anorexia, nausea, vomiting, diarrhea  or change in bowel or urinary habits, change in stools or urine, dysuria,hematuria,  rash, arthralgias, visual complaints, headache, numbness weakness or ataxia or problems with walking or coordination,  change in mood/affect or memory.             Objective:   Physical Exam  amb wf nad   .11/04/2014        230  Wt Readings from Last 3 Encounters:  09/23/14 228 lb (103.42 kg)  08/23/14 226 lb (102.513 kg)  08/08/14 227 lb 12.8 oz (103.329 kg)    Vital signs reviewed       HEENT: nl dentition, turbinates, and orophanx. Nl external ear canals without cough reflex   NECK :  without JVD/Nodes/TM/ nl carotid upstrokes bilaterally   LUNGS: no acc muscle use, clear to A and P bilaterally without no longer cough on exp maneuvers   CV:  RRR  no s3 or murmur or increase in P2, no edema   ABD:  soft and nontender with nl excursion in the supine position. No  bruits or organomegaly, bowel sounds nl  MS:  warm without deformities, calf tenderness, cyanosis or clubbing  SKIN: warm and dry without lesions    NEURO:  alert, approp, no deficits        CXR PA and Lateral:   11/04/2014 :     I personally reviewed images and agree with radiology impression as follows:    No acute abnormalities.  Resolution of previously seen lingular opacity.     Assessment & Plan:

## 2015-10-09 ENCOUNTER — Encounter: Payer: Self-pay | Admitting: Internal Medicine

## 2015-10-09 ENCOUNTER — Ambulatory Visit (INDEPENDENT_AMBULATORY_CARE_PROVIDER_SITE_OTHER): Payer: Managed Care, Other (non HMO) | Admitting: Internal Medicine

## 2015-10-09 VITALS — BP 128/82 | HR 117 | Ht 63.0 in | Wt 222.2 lb

## 2015-10-09 DIAGNOSIS — J45991 Cough variant asthma: Secondary | ICD-10-CM | POA: Diagnosis not present

## 2015-10-09 MED ORDER — AMOXICILLIN-POT CLAVULANATE 875-125 MG PO TABS: 1.0000 | ORAL_TABLET | Freq: Two times a day (BID) | ORAL | Status: DC

## 2015-10-09 NOTE — Assessment & Plan Note (Signed)
-   allergy profile 08/08/2014 >  IgE 33 with neg RAST,  Eos 0.1 - sinus CT 08/15/2014 > bubbly sphenoid sinusitis > rec complete 20 total days augmentin  - added flutter valve 08/23/14 - add dulera 100 08/23/14  - stopped singulair around 09/04/14 no worse  - sinus CT 09/24/2014 > neg  - stopped dulera end of 09/2014 and well as gerd Rx - Flare 10/09/2015  rx as uacs /sinusitis  Explained the natural history of uri and why it's necessary in patients at risk of cyclical cough to treat GERD aggressively - at least  short term -   to reduce risk of evolving cyclical cough initially  triggered by epithelial injury and a heightened sensitivty to the effects of any upper airway irritants,  most importantly acid - related - then perpetuated by epithelial injury related to the cough itself as the upper airway collapses on itself.  That is, the more sensitive the epithelium becomes once it is damaged by the virus, the more the ensuing irritability> the more the cough, the more the secondary reflux (especially in those prone to reflux) the more the irritation of the sensitive mucosa and so on in a  Classic cyclical pattern.    Will also rx with 10 d augmentin but hold off on any prednisone with this flare   I had an extended discussion with the patient reviewing all relevant studies completed to date and  lasting 15 to 20 minutes of a 25 minute visit    Each maintenance medication was reviewed in detail including most importantly the difference between maintenance and prns and under what circumstances the prns are to be triggered using an action plan format that is not reflected in the computer generated alphabetically organized AVS.    Please see instructions for details which were reviewed in writing and the patient given a copy highlighting the part that I personally wrote and discussed at today's ov.

## 2015-10-09 NOTE — Patient Instructions (Addendum)
Augmentin 875 mg take one pill twice daily  X 10 days - take at breakfast and supper with large glass of water.  It would help reduce the usual side effects (diarrhea and yeast infections) if you ate cultured yogurt at lunch.     First take delsym two tsp every 12 hours and use your flutter valve and supplement if needed with  tramadol 50 mg up to 2 every 4 hours to suppress the urge to cough at all or even clear your throat. Swallowing water or using ice chips/non mint and menthol containing candies (such as lifesavers or sugarless jolly ranchers) are also effective.  You should rest your voice and avoid activities that you know make you cough.  Once you have eliminated the cough for 3 straight days try reducing the tramadol first,  then the delsym as tolerated.      Prilosec Take 30-60 min before first meal of the day and Pepcid 20 mg one bedtime plus chlorpheniramine 4 mg x 2 at bedtime (both available over the counter)  until cough is completely gone for at least a week without the need for cough suppression  GERD (REFLUX)  is an extremely common cause of respiratory symptoms, many times with no significant heartburn at all.    It can be treated with medication, but also with lifestyle changes including avoidance of late meals, excessive alcohol, smoking cessation, and avoid fatty foods, chocolate, peppermint, colas, red wine, and acidic juices such as orange juice.  NO MINT OR MENTHOL PRODUCTS SO NO COUGH DROPS  USE HARD CANDY INSTEAD (jolley ranchers or Stover's or Lifesavers (all available in sugarless versions) NO OIL BASED VITAMINS - use powdered substitutes.  Hold on dulera unless you wheeze/ short of breath > up to 2 pffs every 12 hours   For stuffy head > advil cold and sinus as per bottle   For runny nose / itching / sneezing > zyrtec daily   Return if not all better in 2 weeks

## 2015-10-09 NOTE — Progress Notes (Signed)
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 Pulmonary Consult    Referred by Dr. Josie SaundersAsres. Pt c/o cough for the past 2 months- prod at times with yellow to green sputum. She also c/o DOE with walking up stairs. She had PNA Nov 2014.   Multiple abx and steroids since abrupt  onset 2 x months never improved but  better while sleeping then after stirs in am much worse within 30 min regardless of activity  Assoc with urinary incont, sob mostly with cough rec Augmentin 875 mg take one pill twice daily  X 10 days - take at breakfast and supper with large glass of water.  It would help reduce the usual side effects (diarrhea and yeast infections) if you ate cultured yogurt at lunch.  Please see patient coordinator before you leave today  to schedule sinus ct> sphenoid sinusitis/ bubbly secrections > extended augmentinto 20 days rx First take delsym two tsp every 12 hours and supplement if needed with  tramadol 50 mg up to 2 every 4 hours     Prednisone 10 mg take  4 each am x 2 days,   2 each am x 2 days,  1 each am x 2 days and stop (this is to eliminate allergies and inflammation from coughing) Protonix (pantoprazole) Take 30-60 min before first meal of the day and Pepcid 20 mg one bedtime plus chlorpheniramine 4 mg x 2 at bedtime (both available over the counter)  until cough is completely gone for at least a week without the need for cough suppression GERD diet   08/23/2014 f/u  ov/Susan Brock re: cough / ? Lingular atx  Chief Complaint  Patient presents with  . Follow-up    CXR done today. Cough has improved some. Still has some occ light green sputum. Her chest still feels tight at times and breathing not completely back to baseline yet. She has not used rescue inhaler in the past wk.   Most green mucus is in am/ has one more week of augmentin  Not using ventolin this week  rec dulera 100 Take 2 puffs first thing in am and then another 2 puffs about 12 hours later.  Only use your albuterol as a rescue medication Finish antibiotics  Check with your insurance formulary re best option for protonix alternative  Use the flutter valve as much as you can        09/23/2014 f/u ov/Susan Brock re: cough since Nov 2015 ? Cough variant asthma? Vs uacs  Chief Complaint  Patient presents with  . Follow-up    Pt states her cough is some better. Chest does not feel tight anymore. She has not had to use rescue inhaler since her last visit. No new co's today.   cough was green now minimal yellow, some p stir p stirs in am  Stopped singulair on her own x 3 weeks as did  not think it helped  dulera 100 2bid  rec Please see patient coordinator before you leave today  to schedule sinus CT and we will arrange referral to Dr Jenne Pane if needed Continue PPI in the form of prilosec otc 20 mg Take 30- 60 min before your first and last meals of the day until 100% better, then try  Continue dulera 100 Take 2 puffs first thing in am and then another 2 puffs about 12 hours late    11/04/2014 f/u ov/Susan Brock re: lingular atx /cough variant asthma Chief Complaint  Patient presents with  . Follow-up    Pt states cough was some worse for the past several days- relates to spending time outside. Cough is non prod. She has stopped acid suppresion and no longer taking cough suppressants.   stopped dulera sev weeks prior to OV  And also no longer on gerd rx/ just zyrtec. Minimal Cough day > noct, dry/ no need  For  saba  rec For any cough flare 1st retart dulera and if not rapidly improving add the reflux medications back Continue zyrtec as needed      10/09/2015 acute extended ov/Susan Brock re: recurrent cough / maint on zyrtec year round despite neg allergy profile  Chief Complaint  Patient presents with  . Acute Visit    Pt c/o sore throat and sinus drainage x 4 days. She started coughing 1 day ago- non prod. Her chest feels tight if she takes a deep breath.   daughter sick then onset of nasal symptoms and sorethroat acutely a few days later with watery rhinitis and sensation of stuffy head even after multiple otc's including various forms of pseuodfed  No obvious day to day or daytime variability or assoc chronic cough or cp or chest tightness, subjective wheeze or overt sinus or hb symptoms. No unusual exp hx or h/o childhood pna/ asthma or knowledge of premature birth.  Sleeping ok without nocturnal  or early am exacerbation  of respiratory  c/o's or need for noct saba. Also denies any obvious fluctuation of symptoms with weather or environmental changes or other aggravating or alleviating factors except as outlined above   Current Medications, Allergies, Complete Past Medical History, Past Surgical History, Family History, and Social History were reviewed in [employer] record.  ROS  The following are not active complaints unless bolded sore throat, dysphagia, dental problems, itching, sneezing,  nasal congestion or excess secretions, ear ache,   fever, chills, sweats, unintended wt loss, classically pleuritic or exertional cp, hemoptysis,  orthopnea pnd or leg swelling, presyncope, palpitations, abdominal pain, anorexia, nausea, vomiting, diarrhea  or change in bowel or bladder habits, change in stools or urine, dysuria,hematuria,  rash, arthralgias, visual complaints, headache, numbness, weakness or ataxia or problems with walking or coordination,  change in mood/affect or  memory.              .             Objective:   Physical Exam  amb wf nad very nasal tone to voice   .11/04/2014        230  > 10/09/2015  222  09/23/14 228 lb (103.42 kg)  08/23/14 226 lb (102.513 kg)  08/08/14 227 lb 12.8 oz (103.329 kg)    Vital signs reviewed       HEENT: nl dentition,   and orophanx. Nl external ear canals without cough reflex - moderate non-specific turbinate edema bilaterally    NECK :  without  JVD/Nodes/TM/ nl carotid upstrokes bilaterally   LUNGS: no acc muscle use, clear to A and P bilaterally without cough on insp or exp    CV:  RRR  no s3 or murmur or increase in P2, no edema   ABD:  soft and nontender with nl excursion in the supine position. No bruits or organomegaly, bowel sounds nl  MS:  warm without deformities, calf tenderness, cyanosis or clubbing  SKIN: warm and dry without lesions    NEURO:  alert, approp, no deficits            Assessment & Plan:

## 2015-10-28 ENCOUNTER — Ambulatory Visit: Payer: Managed Care, Other (non HMO) | Admitting: Internal Medicine

## 2016-06-10 ENCOUNTER — Ambulatory Visit (INDEPENDENT_AMBULATORY_CARE_PROVIDER_SITE_OTHER): Payer: Managed Care, Other (non HMO)

## 2016-06-10 ENCOUNTER — Encounter: Payer: Self-pay | Admitting: Podiatry

## 2016-06-10 ENCOUNTER — Ambulatory Visit (INDEPENDENT_AMBULATORY_CARE_PROVIDER_SITE_OTHER): Payer: Managed Care, Other (non HMO) | Admitting: Podiatry

## 2016-06-10 VITALS — Ht 63.0 in | Wt 239.0 lb

## 2016-06-10 DIAGNOSIS — M79671 Pain in right foot: Secondary | ICD-10-CM

## 2016-06-10 DIAGNOSIS — M722 Plantar fascial fibromatosis: Secondary | ICD-10-CM | POA: Diagnosis not present

## 2016-06-10 MED ORDER — TRIAMCINOLONE ACETONIDE 10 MG/ML IJ SUSP
10.0000 mg | Freq: Once | INTRAMUSCULAR | Status: AC
  Administered 2016-06-10: 10 mg

## 2016-06-10 MED ORDER — DICLOFENAC SODIUM 75 MG PO TBEC: 75.0000 mg | DELAYED_RELEASE_TABLET | Freq: Two times a day (BID) | ORAL | 2 refills | Status: DC

## 2016-06-10 NOTE — Progress Notes (Signed)
Subjective:     Patient ID: Susan MattesChristina D Tsou, female   DOB: 06/03/1981, 35 y.o.   MRN: [id number]  HPI patient presents with a lot of pain plantar aspect right heel just distal to the insertion into the calcaneus with inflammation and fluid buildup noted. Very tender for her and states that it's been going on now for about 5 months   Review of Systems  All other systems reviewed and are negative.      Objective:   Physical Exam  Constitutional: She is oriented to person, place, and time.  Cardiovascular: Intact distal pulses.   Musculoskeletal: Normal range of motion.  Neurological: She is oriented to person, place, and time.  Skin: Skin is warm.  Nursing note and vitals reviewed.  neurovascular status intact muscle strength adequate range of motion within normal limits with patient found to have inflammatory changes plantar aspect right heel at the insertional point of the tendon into the calcaneus with inflammation and fluid buildup noted. Patient's found have moderate depression of the arch and the pain is just distal to the insertion calcaneus     Assessment:     Acute plantar fasciitis right inflammation fluid with chronic problems of the arch    Plan:     H&P conditions and x-rays reviewed. Injected the plantar fascia right 3 mg Kenalog 5 mill grams Xylocaine dispensed fascial brace with instructions placed on diclofenac 75 mg twice a day and gave instructions on physical therapy. I then discussed long-term orthotics and we will scan her for these once we have this condition improved  X-ray indicated small spur with no indications of stress fracture or arthritis consider

## 2016-06-10 NOTE — Patient Instructions (Signed)

## 2016-07-02 ENCOUNTER — Ambulatory Visit (INDEPENDENT_AMBULATORY_CARE_PROVIDER_SITE_OTHER): Payer: Managed Care, Other (non HMO) | Admitting: Podiatry

## 2016-07-02 DIAGNOSIS — M722 Plantar fascial fibromatosis: Secondary | ICD-10-CM | POA: Diagnosis not present

## 2016-07-04 NOTE — Progress Notes (Signed)
Subjective:     Patient ID: Susan Brock, female   DOB: 10/29/1980, 35 y.o.   MRN: [id number]  HPI patient presents stating that I still have pain in my heel and it's worse when I get up in the morning and after sitting   Review of Systems     Objective:   Physical Exam Neurovascular status intact muscle strength was adequate with mild equinus noted. Patient's found to have depression of the arch and continued discomfort in the right plantar fascia at the insertional point tendon into the calcaneus    Assessment:     Plantar fasciitis with moderate depression of the arch noted    Plan:     H&P condition reviewed and recommended long-term night splint usage along with orthotics to support the plantar arch. Patient is scanned for custom orthotics at this time with education rendered for continued physical therapy and stretching exercises and not going barefoot

## 2016-07-23 ENCOUNTER — Ambulatory Visit (INDEPENDENT_AMBULATORY_CARE_PROVIDER_SITE_OTHER): Payer: Self-pay | Admitting: Podiatry

## 2016-07-23 DIAGNOSIS — M722 Plantar fascial fibromatosis: Secondary | ICD-10-CM

## 2016-07-23 NOTE — Patient Instructions (Signed)

## 2016-08-04 NOTE — Progress Notes (Signed)
Patient presents for orthotic pick up.  Verbal and written break in and wear instructions given.  Patient will follow up in 4 weeks if symptoms worsen or fail to improve. 

## 2016-08-13 ENCOUNTER — Telehealth: Payer: Self-pay | Admitting: Gastroenterology

## 2016-09-01 NOTE — Telephone Encounter (Signed)
Dr. Lavon PaganiniNandigam reviewed records and has declined to accept patient. Patient has been informed.

## 2016-09-08 IMAGING — CR DG CHEST 2V
2 series · 2 of 2 positions shown · non-contrast
Comparison: PA and lateral chest x-ray August 08, 2014.

CLINICAL DATA: Follow-up of pneumonia; persistent chest tightness
and cough

EXAM:
CHEST  2 VIEW

[view not recorded (1 of 2)]
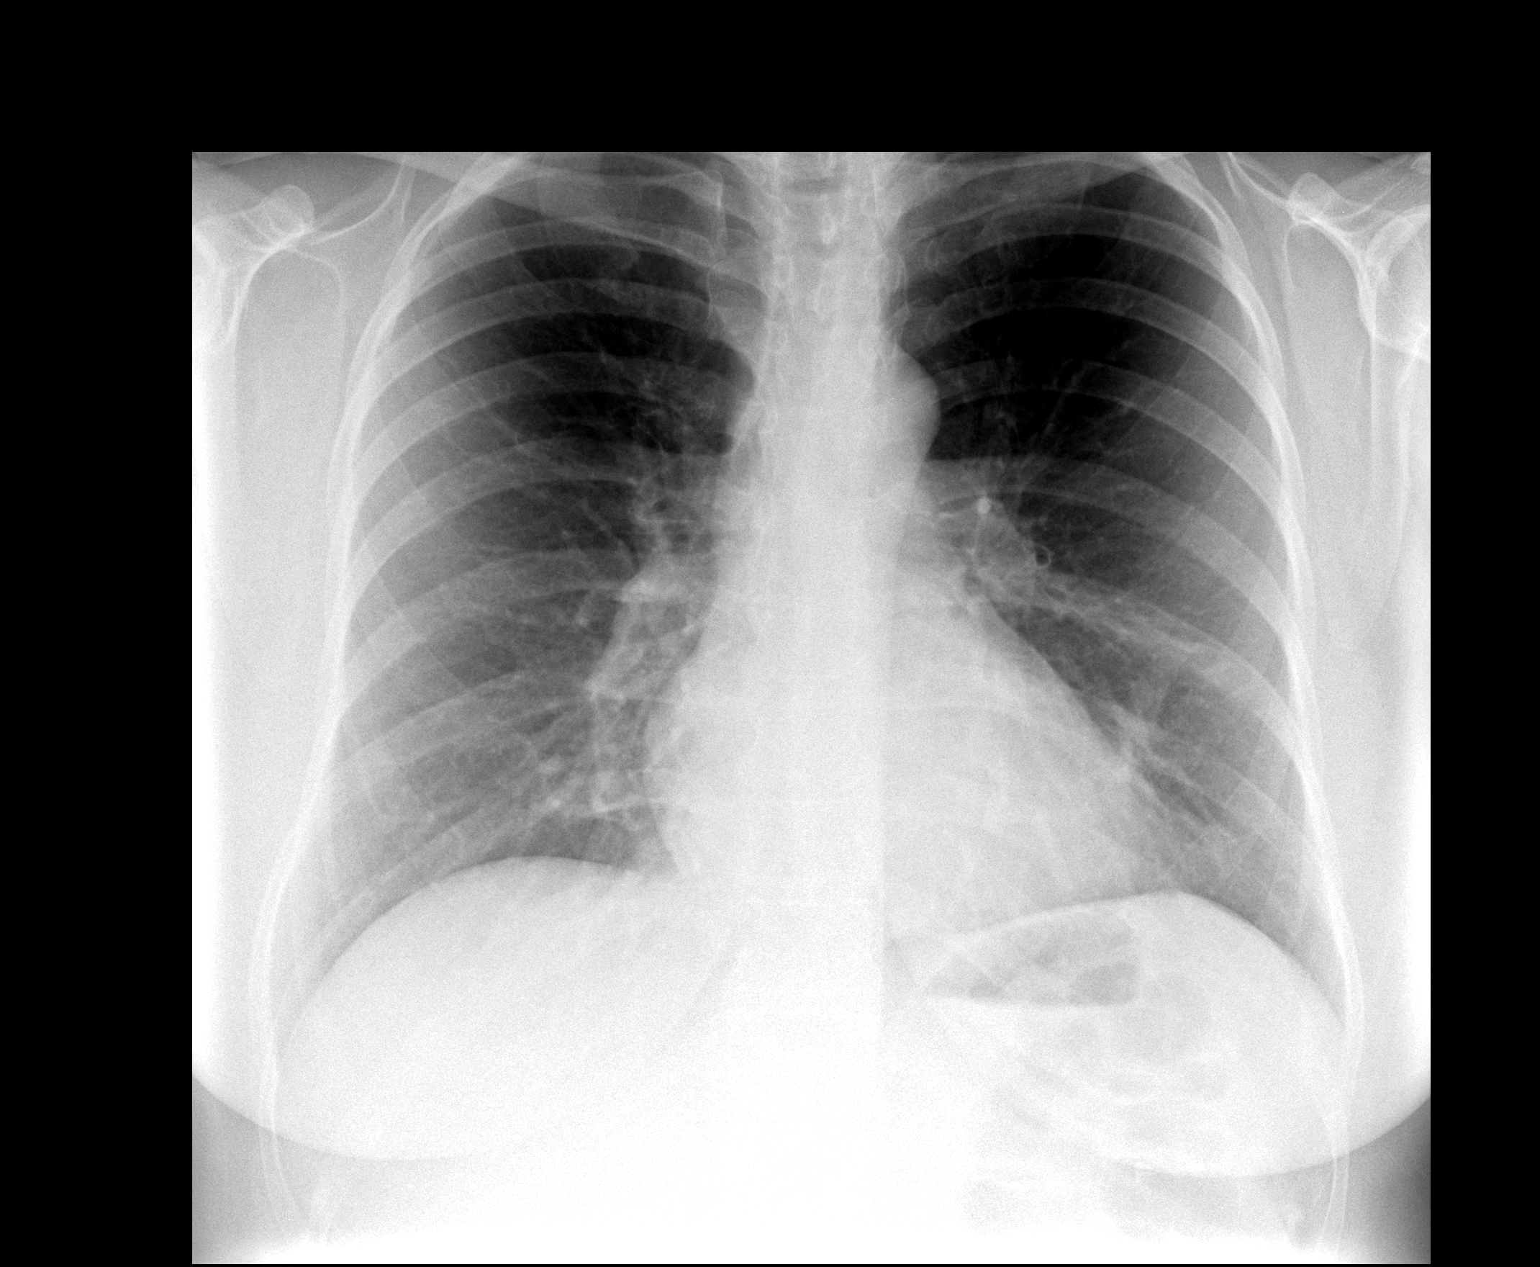

[view not recorded (2 of 2)]
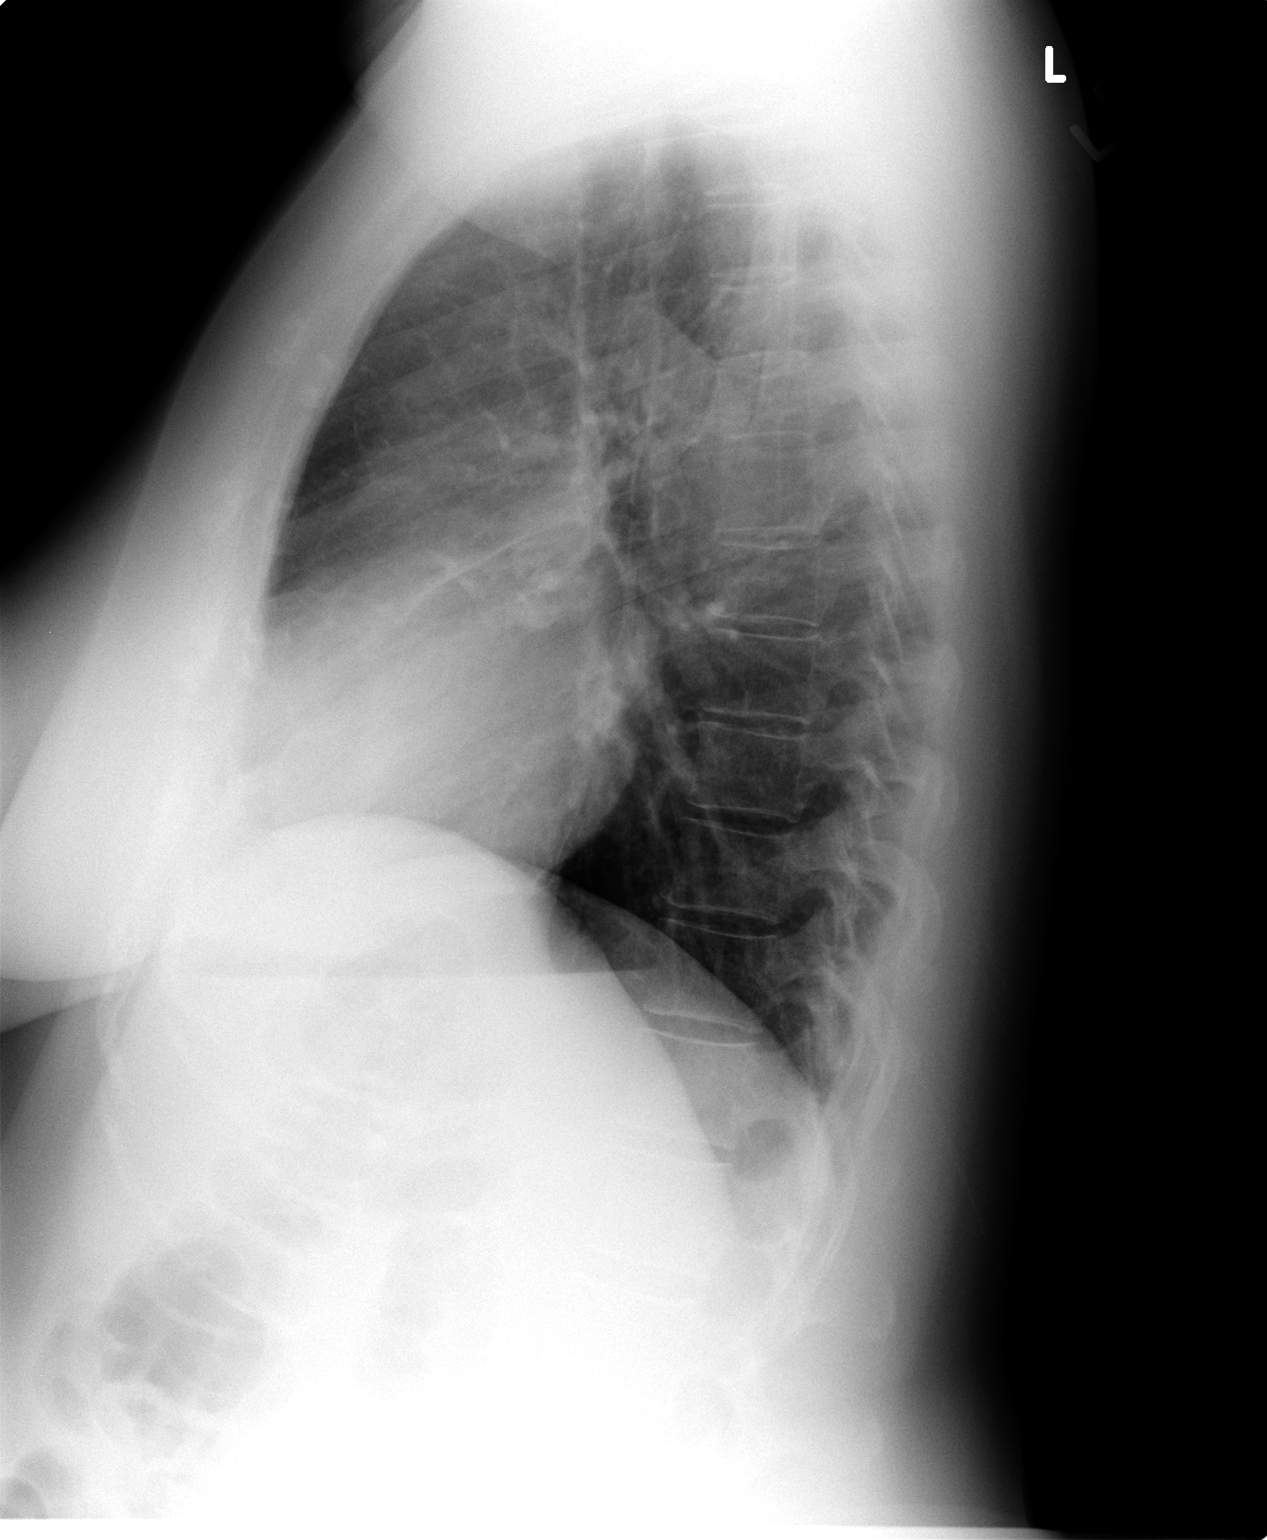

[2 of 2 positions shown; findings below may reference images not displayed]

FINDINGS: The lungs are well-expanded. There is persistent increased density
adjacent to the lingula not greatly changed from the previous study.
No new parenchymal densities are demonstrated. There is no pleural
effusion or pneumothorax. The heart and pulmonary vascularity are
normal. The trachea is midline. The bony thorax is unremarkable.
IMPRESSION: There is stable mildly increased density in the lingula which may
reflect persistent infiltrate, atelectasis, or scarring. If the
patient's symptoms are not improving despite a course of
antibiotics, chest CT scanning now may be useful.

## 2016-10-20 ENCOUNTER — Ambulatory Visit (INDEPENDENT_AMBULATORY_CARE_PROVIDER_SITE_OTHER): Payer: Managed Care, Other (non HMO) | Admitting: Podiatry

## 2016-10-20 ENCOUNTER — Encounter: Payer: Self-pay | Admitting: Podiatry

## 2016-10-20 DIAGNOSIS — M722 Plantar fascial fibromatosis: Secondary | ICD-10-CM | POA: Diagnosis not present

## 2016-10-20 MED ORDER — TRIAMCINOLONE ACETONIDE 10 MG/ML IJ SUSP
10.0000 mg | Freq: Once | INTRAMUSCULAR | Status: AC
  Administered 2016-10-20: 10 mg

## 2016-10-21 NOTE — Progress Notes (Signed)
Subjective:     Patient ID: Susan MattesChristina D Brock, female   DOB: 04/03/1981, 36 y.o.   MRN: [id number]  HPI patient states the right heel is still hurting but it seems to be more on the middle or outside   Review of Systems     Objective:   Physical Exam Neurovascular status intact muscle strength adequate with chronic plantar fasciitis noted right but pain has migrated both the central and lateral band of the fascia    Assessment:     Lateral band fasciitis    Plan:     This may be compensation for medial pain or may be a separate-type entity and today went ahead and injected the lateral fascia 3 mg Kenalog 5 mg Xylocaine and we'll see back again in 3 weeks and encouraged night splint usage

## 2016-11-17 ENCOUNTER — Ambulatory Visit: Payer: Managed Care, Other (non HMO) | Admitting: Podiatry

## 2016-11-25 ENCOUNTER — Ambulatory Visit (INDEPENDENT_AMBULATORY_CARE_PROVIDER_SITE_OTHER): Payer: Managed Care, Other (non HMO) | Admitting: Podiatry

## 2016-11-25 ENCOUNTER — Encounter: Payer: Self-pay | Admitting: Podiatry

## 2016-11-25 DIAGNOSIS — M722 Plantar fascial fibromatosis: Secondary | ICD-10-CM | POA: Diagnosis not present

## 2016-11-25 NOTE — Progress Notes (Signed)
Subjective:    Patient ID: Susan Brock, female   DOB: 36 y.o.   MRN: [id number]   HPI patient states that she seems to be doing somewhat better but gets occasional sharp pains    ROS      Objective:  Physical Exam Neurovascular status intact negative Homans sign was noted with deep palpation of the plantar fashion not eliciting currently significant discomfort with no radiating shooting-like discomfort present    Assessment:    Central lateral band plantar fasciitis right that seems to be localized in nature with no significant pathology     Plan:    Reviewed condition and recommended physical therapy ice therapy supportive shoe gear therapy and continued orthotic usage. If symptoms were to get worse or recur she is going to re-appointment at that time

## 2017-06-27 ENCOUNTER — Ambulatory Visit: Payer: Managed Care, Other (non HMO) | Admitting: Internal Medicine

## 2017-06-27 ENCOUNTER — Encounter: Payer: Self-pay | Admitting: Internal Medicine

## 2017-06-27 VITALS — BP 128/88 | HR 90 | Temp 97.8°F | Ht 63.0 in | Wt 222.0 lb

## 2017-06-27 DIAGNOSIS — J45991 Cough variant asthma: Secondary | ICD-10-CM | POA: Diagnosis not present

## 2017-06-27 LAB — NITRIC OXIDE: NITRIC OXIDE: 24

## 2017-06-27 MED ORDER — TRAMADOL HCL 50 MG PO TABS: ORAL_TABLET | ORAL | 0 refills | Status: DC

## 2017-06-27 MED ORDER — PREDNISONE 10 MG PO TABS: ORAL_TABLET | ORAL | 0 refills | Status: DC

## 2017-06-27 MED ORDER — AMOXICILLIN-POT CLAVULANATE 875-125 MG PO TABS: 1.0000 | ORAL_TABLET | Freq: Two times a day (BID) | ORAL | 0 refills | Status: AC

## 2017-06-27 NOTE — Patient Instructions (Addendum)
Augmentin 875 mg take one pill twice daily  X 10 days - take at breakfast and supper with large glass of water.  It would help reduce the usual side effects (diarrhea and yeast infections) if you ate cultured yogurt at lunch.   First take delsym two tsp every 12 hours and use your flutter valve and supplement if needed with  tramadol 50 mg up to 2 every 4 hours to suppress the urge to cough at all or even clear your throat. Swallowing water or using ice chips/non mint and menthol containing candies (such as lifesavers or sugarless jolly ranchers) are also effective.  You should rest your voice and avoid activities that you know make you cough.  Once you have eliminated the cough for 3 straight days try reducing the tramadol first,  then the delsym as tolerated.    Prilosec 20 mg x 2  Take 30-60 min before first meal of the day and Pepcid 20 mg one bedtime plus chlorpheniramine 4 mg x 2 at bedtime (both available over the counter)  until cough is completely gone for at least a week without the need for cough suppression  GERD (REFLUX)  is an extremely common cause of respiratory symptoms, many times with no significant heartburn at all.    It can be treated with medication, but also with lifestyle changes including avoidance of late meals, excessive alcohol, smoking cessation, and avoid fatty foods, chocolate, peppermint, colas, red wine, and acidic juices such as orange juice.  NO MINT OR MENTHOL PRODUCTS SO NO COUGH DROPS  USE HARD CANDY INSTEAD (jolley ranchers or Stover's or Lifesavers (all available in sugarless versions) NO OIL BASED VITAMINS - use powdered substitutes.    For stuffy head > advil cold and sinus as per bottle   For runny nose / itching / sneezing >  zyrtec daily   Return if not all better in 2 weeks

## 2017-06-27 NOTE — Progress Notes (Signed)
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 Pulmonary Consult    Referred by Dr. Josie SaundersAsres. Pt c/o cough for the past 2 months- prod at times with yellow to green sputum. She also c/o DOE with walking up stairs. She had PNA Nov 2014.   Multiple abx and steroids since abrupt  onset 2 x months never improved but  better while sleeping then after stirs in am much worse within 30 min regardless of activity  Assoc with urinary incont, sob mostly with cough rec Augmentin 875 mg take one pill twice daily  X 10 days - take at breakfast and supper with large glass of water.  It would help reduce the usual side effects (diarrhea and yeast infections) if you ate cultured yogurt at lunch.  Please see patient coordinator before you leave today  to schedule sinus ct> sphenoid sinusitis/ bubbly secrections > extended augmentinto 20 days rx First take delsym two tsp every 12 hours and supplement if needed with  tramadol 50 mg up to 2 every 4 hours     Prednisone 10 mg take  4 each am x 2 days,   2 each am x 2 days,  1 each am x 2 days and stop (this is to eliminate allergies and inflammation from coughing) Protonix (pantoprazole) Take 30-60 min before first meal of the day and Pepcid 20 mg one bedtime plus chlorpheniramine 4 mg x 2 at bedtime (both available over the counter)  until cough is completely gone for at least a week without the need for cough suppression GERD diet   08/23/2014 f/u  ov/Wert re: cough / ? Lingular atx  Chief Complaint  Patient presents with  . Follow-up    CXR done today. Cough has improved some. Still has some occ light green sputum. Her chest still feels tight at times and breathing not completely back to baseline yet. She has not used rescue inhaler in the past wk.   Most green mucus is in am/ has one more week of augmentin  Not using ventolin this week  rec dulera 100 Take 2 puffs first thing in am and then another 2 puffs about 12 hours later.  Only use your albuterol as a rescue medication Finish antibiotics  Check with your insurance formulary re best option for protonix alternative  Use the flutter valve as much as you can        09/23/2014 f/u ov/Wert re: cough since Nov 2015 ? Cough variant asthma? Vs uacs  Chief Complaint  Patient presents with  . Follow-up    Pt states her cough is some better. Chest does not feel tight anymore. She has not had to use rescue inhaler since her last visit. No new co's today.   cough was green now minimal yellow, some p stir p stirs in am  Stopped singulair on her own x 3 weeks as did  not think it helped  dulera 100 2bid  rec Please see patient coordinator before you leave today  to schedule sinus CT and we will arrange referral to Dr Jenne Pane if needed Continue PPI in the form of prilosec otc 20 mg Take 30- 60 min before your first and last meals of the day until 100% better, then try  Continue dulera 100 Take 2 puffs first thing in am and then another 2 puffs about 12 hours late    11/04/2014 f/u ov/Wert re: lingular atx /cough variant asthma Chief Complaint  Patient presents with  . Follow-up    Pt states cough was some worse for the past several days- relates to spending time outside. Cough is non prod. She has stopped acid suppresion and no longer taking cough suppressants.   stopped dulera sev weeks prior to OV  And also no longer on gerd rx/ just zyrtec. Minimal Cough day > noct, dry/ no need  For  saba  rec For any cough flare 1st retart dulera and if not rapidly improving add the reflux medications back Continue zyrtec as needed      10/09/2015 acute extended ov/Wert re: recurrent cough / maint on zyrtec year round despite neg allergy profile  Chief Complaint  Patient presents with  . Acute Visit    Pt c/o sore throat and sinus drainage x 4 days. She started coughing 1 day ago- non prod. Her chest feels tight if she takes a deep breath.   daughter sick then onset of nasal symptoms and sorethroat acutely a few days later with watery rhinitis and sensation of stuffy head even after multiple otc's including various forms of pseuodfed rec Augmentin 875 mg take one pill twice daily  X 10 days - take at breakfast and supper with large glass of water.  It would help reduce the usual side effects (diarrhea and yeast infections) if you ate cultured yogurt at lunch.  First take delsym two tsp every 12 hours and use your flutter valve and supplement if needed with  tramadol 50 mg up to 2 every 4 hours to suppress the urge to cough at all or even clear your throat. Swallowing water or using ice chips/non mint and menthol containing candies (such as lifesavers or sugarless jolly ranchers) are also effective.  You should rest your voice and avoid activities that you know make you cough. Once you have eliminated the cough for 3 straight days try reducing the tramadol first,  then the delsym as tolerated.   Prilosec Take 30-60 min before first meal of the day and Pepcid 20 mg one bedtime plus chlorpheniramine 4 mg x 2 at bedtime (both available over the counter)  until cough is completely gone for at least a week without the need for cough suppression GERD  Hold on dulera unless you wheeze/ short of breath > up to 2 pffs every 12 hours  For stuffy head > advil cold and sinus as per bottle  For runny nose / itching / sneezing > zyrtec daily  Return if not all better in 2 weeks      06/27/2017  f/u  ov/Wert re: recurrent cough  Chief Complaint  Patient presents with  . Acute Visit    Pt states started having chest tightness 5 days ago, then wheezing and cough started 3 days ago. Cough has been non prod. She had some chills today, but no fever.    did fine for over a year on just zyrtec  2  weeks prior to OV  Sore throat/ nasal congestion green mucus > no  treatment  Then 06/23/2017 chest tight and had no inhalers so started prilosec ac once a day then chlor Has flutter but can't find it  Cough is worse at hs and first thing in am but sleeps fine flat Only  A  little better p daughter's albuterol   No obvious day to day or daytime variability or assoc excess/ purulent sputum or mucus plugs or hemoptysis or cp or chest tightness, subjective wheeze or overt sinus or hb symptoms. No unusual exposure hx or h/o childhood pna/ asthma or knowledge of premature birth.  Sleeping ok flat without nocturnal   exacerbation  of respiratory  c/o's or need for noct saba. Also denies any obvious fluctuation of symptoms with weather or environmental changes or other aggravating or alleviating factors except as outlined above   Current Allergies, Complete Past Medical History, Past Surgical History, Family History, and Social History were reviewed in [employer] record.  ROS  The following are not active complaints unless bolded Hoarseness, sore throat, dysphagia, dental problems, itching, sneezing,  nasal congestion or discharge of excess mucus or purulent secretions, ear ache,   fever, chills, sweats, unintended wt loss or wt gain, classically pleuritic or exertional cp,  orthopnea pnd or leg swelling, presyncope, palpitations, abdominal pain, anorexia, nausea, vomiting, diarrhea  or change in bowel habits or change in bladder habits, change in stools or change in urine, dysuria, hematuria,  rash, arthralgias, visual complaints, headache, numbness, weakness or ataxia or problems with  walking or coordination,  change in mood/affect or memory.        Current Meds  Medication Sig  . Ascorbic Acid (VITAMIN C) 1000 MG tablet Take 1,000 mg by mouth daily.  . cetirizine (ZYRTEC) 10 MG tablet Take 10 mg by mouth daily.  . chlorpheniramine (CHLOR-TRIMETON) 4 MG tablet Take 4 mg by mouth every 4 (four) hours as needed for allergies.  Marland Kitchen diclofenac (VOLTAREN) 75 MG EC tablet Take 75 mg by mouth 2 (two) times daily as needed.  . hyoscyamine (LEVSIN, ANASPAZ) 0.125 MG tablet Take 0.125 mg by mouth every 6 (six) hours as needed.  . loperamide (IMODIUM A-D) 2 MG tablet Take 2 mg by mouth.  Marland Kitchen omeprazole (PRILOSEC OTC) 20 MG tablet Take 20 mg by mouth daily.  . Probiotic Product (PROBIOTIC DAILY PO) Take 1 tablet by mouth daily. Pt takes Align     U                .             Objective:   Physical Exam   amb obese wf nad   .11/04/2014        230  > 10/09/2015  222  09/23/14 228 lb (103.42 kg)  08/23/14 226 lb (102.513 kg)  08/08/14 227 lb 12.8 oz (103.329 kg)         HEENT: nl dentition,  and oropharynx. Nl external ear canals without cough reflex - moderate bilateral non-specific turbinate edema     NECK :  without JVD/Nodes/TM/ nl carotid upstrokes bilaterally   LUNGS: no acc muscle use,  Nl contour chest which is clear to A and P bilaterally without cough on insp or exp maneuvers   CV:  RRR  no s3 or murmur or increase in P2, and no edema   ABD:  soft and nontender with nl inspiratory excursion in the supine position. No  bruits or organomegaly appreciated, bowel sounds nl  MS:  Nl gait/ ext warm without deformities, calf tenderness, cyanosis or clubbing No obvious joint restrictions   SKIN: warm and dry without lesions    NEURO:  alert, approp, nl sensorium with  no motor or cerebellar deficits apparent.            Assessment & Plan:

## 2017-06-28 ENCOUNTER — Encounter: Payer: Self-pay | Admitting: Internal Medicine

## 2017-06-28 NOTE — Assessment & Plan Note (Addendum)
-   allergy profile 08/08/2014 >  IgE 33 with neg RAST,  Eos 0.1 - sinus CT 08/15/2014 > bubbly sphenoid sinusitis > rec complete 20 total days augmentin  - added flutter valve 08/23/14 - add dulera 100 08/23/14  - stopped singulair around 09/04/14 no worse  - sinus CT 09/24/2014 > neg  - stopped dulera end of 09/2014 and well as gerd Rx - Flare 10/09/2015  rx as uacs /sinusitis   - FENO 06/27/2017  =   24 during flare  - added flutter valve 06/27/2017 / trained on use   Even in setting of active flare no def evidence of chronic asthma with mostly upper airway symptoms so will rx acutely for ? Sinusitis/ bronchitis and leave off inhalers for now since they can aggravate uacs    Also, while not intuitively obvious, many patients with chronic low grade reflux do not cough until there is a primary insult that disturbs the protective epithelial barrier and exposes sensitive nerve endings.   This is typically viral as may have been the case here originally.  The point is that once this occurs, it is difficult to eliminate the cycle  using anything but a maximally effective acid suppression regimen at least in the short run, accompanied by an appropriate diet to address non acid GERD and control / eliminate the cough itself for at least 3 days with use of tramadol/ flutter valve   I had an extended discussion with the patient reviewing all relevant studies completed to date and  lasting 15 to 20 minutes of a 25 minute acute office visit    Each maintenance medication was reviewed in detail including most importantly the difference between maintenance and prns and under what circumstances the prns are to be triggered using an action plan format that is not reflected in the computer generated alphabetically organized AVS.    Please see AVS for specific instructions unique to this visit that I personally wrote and verbalized to the the pt in detail and then reviewed with pt  by my nurse highlighting any  changes in  therapy recommended at today's visit to their plan of care.

## 2017-07-12 ENCOUNTER — Encounter (HOSPITAL_COMMUNITY): Payer: Self-pay | Admitting: *Deleted

## 2017-07-12 ENCOUNTER — Emergency Department (HOSPITAL_COMMUNITY): Payer: Managed Care, Other (non HMO)

## 2017-07-12 ENCOUNTER — Emergency Department (HOSPITAL_COMMUNITY)
Admission: EM | Admit: 2017-07-12 | Discharge: 2017-07-12 | Disposition: A | Discharge status: Home / Self Care | Payer: Managed Care, Other (non HMO) | Attending: Emergency Medicine | Admitting: Emergency Medicine

## 2017-07-12 DIAGNOSIS — R1011 Right upper quadrant pain: Secondary | ICD-10-CM | POA: Diagnosis present

## 2017-07-12 DIAGNOSIS — K8050 Calculus of bile duct without cholangitis or cholecystitis without obstruction: Secondary | ICD-10-CM

## 2017-07-12 DIAGNOSIS — R1010 Upper abdominal pain, unspecified: Secondary | ICD-10-CM

## 2017-07-12 LAB — URINALYSIS, ROUTINE W REFLEX MICROSCOPIC
Bilirubin Urine: NEGATIVE
Glucose, UA: NEGATIVE mg/dL
HGB URINE DIPSTICK: NEGATIVE
KETONES UR: 20 mg/dL — AB
LEUKOCYTES UA: NEGATIVE
Nitrite: NEGATIVE
PROTEIN: NEGATIVE mg/dL
Specific Gravity, Urine: 1.024 (ref 1.005–1.030)
pH: 5 (ref 5.0–8.0)

## 2017-07-12 LAB — I-STAT BETA HCG BLOOD, ED (MC, WL, AP ONLY)

## 2017-07-12 LAB — COMPREHENSIVE METABOLIC PANEL
ALK PHOS: 69 U/L (ref 38–126)
ALT: 30 U/L (ref 14–54)
AST: 32 U/L (ref 15–41)
Albumin: 4.2 g/dL (ref 3.5–5.0)
Anion gap: 11 (ref 5–15)
BUN: 6 mg/dL (ref 6–20)
CALCIUM: 9.2 mg/dL (ref 8.9–10.3)
CO2: 20 mmol/L — ABNORMAL LOW (ref 22–32)
CREATININE: 0.7 mg/dL (ref 0.44–1.00)
Chloride: 105 mmol/L (ref 101–111)
Glucose, Bld: 107 mg/dL — ABNORMAL HIGH (ref 65–99)
Potassium: 3.2 mmol/L — ABNORMAL LOW (ref 3.5–5.1)
Sodium: 136 mmol/L (ref 135–145)
Total Bilirubin: 0.9 mg/dL (ref 0.3–1.2)
Total Protein: 7.2 g/dL (ref 6.5–8.1)

## 2017-07-12 LAB — CBC
HCT: 44.4 % (ref 36.0–46.0)
Hemoglobin: 15.1 g/dL — ABNORMAL HIGH (ref 12.0–15.0)
MCH: 29.7 pg (ref 26.0–34.0)
MCHC: 34 g/dL (ref 30.0–36.0)
MCV: 87.4 fL (ref 78.0–100.0)
PLATELETS: 206 10*3/uL (ref 150–400)
RBC: 5.08 MIL/uL (ref 3.87–5.11)
RDW: 14.6 % (ref 11.5–15.5)
WBC: 15.2 10*3/uL — AB (ref 4.0–10.5)

## 2017-07-12 LAB — LIPASE, BLOOD: Lipase: 25 U/L (ref 11–51)

## 2017-07-12 MED ORDER — TRAMADOL HCL 50 MG PO TABS: 50.0000 mg | ORAL_TABLET | Freq: Four times a day (QID) | ORAL | 0 refills | Status: AC | PRN

## 2017-07-12 MED ORDER — GI COCKTAIL ~~LOC~~
30.0000 mL | Freq: Once | ORAL | Status: AC
  Administered 2017-07-12: 30 mL via ORAL
  Filled 2017-07-12: qty 30

## 2017-07-12 NOTE — Discharge Instructions (Signed)
Return here as needed. Follow up with the doctor provided.  °

## 2017-07-12 NOTE — ED Notes (Signed)
Pt returned from ultrasound-- pt states pain is 3/10 at present.

## 2017-07-12 NOTE — ED Notes (Signed)
Pt transported to Ultrasound.  

## 2017-07-12 NOTE — ED Triage Notes (Signed)
Pt has epigastric pain which feels "like its about to explode". Pt reports reports pressure, nausea.  Pt has been on a Keto diet.

## 2017-07-12 NOTE — ED Provider Notes (Signed)
[facility] EMERGENCY DEPARTMENT Provider Note   CSN: [id number] Arrival date & time: 07/12/17  0231    History   Chief Complaint Chief Complaint  Patient presents with  . Abdominal Pain    HPI Susan Brock is a 36 y.o. female.   36 year old female presents to the emergency department for evaluation of abdominal pain.  Patient reports waking from sleep with a sharp pain in her epigastrium.  In triage she states that it feels "like it is about to explode".  She states that pain is migrated towards her right upper abdomen.  It has largely improved.  She did take Gas-X prior to arrival and fetus, but did not feel as though this helped her symptoms.  She had sudden onset of sweats and nausea when her pain began.  She has had no vomiting, shortness of breath, bowel changes, urinary symptoms.  No history of abdominal surgeries.  She denies any recent fevers.  The patient is currently on the keto diet.  She has been following this diet plan for the past 6 months.      Past Medical History:  Diagnosis Date  . Allergy to mold   . Pneumonia 2014  . Seasonal allergies     Patient Active Problem List   Diagnosis Date Noted  . 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 CAP (community acquired pneumonia) 08/08/2014    Past Surgical History:  Procedure Laterality Date  . TONSILLECTOMY  1990    OB History    No data available       Home Medications    Prior to Admission medications   Medication Sig Start Date End Date Taking? Authorizing Provider  albuterol (PROVENTIL HFA;VENTOLIN HFA) 108 (90 BASE) MCG/ACT inhaler Inhale 1-2 puffs into the lungs every 6 (six) hours as needed for wheezing or shortness of breath.   Yes [provider]  amoxicillin-clavulanate (AUGMENTIN) 875-125 MG tablet Take 1 tablet by mouth 2 (two) times daily. Started on 06-27-17 for 10 day supply. Patient completed course on 07-07-17   Yes [provider]    Ascorbic Acid (VITAMIN C) 1000 MG tablet Take 1,000 mg by mouth daily.   Yes [provider]  cetirizine (ZYRTEC) 10 MG tablet Take 10 mg by mouth daily.   Yes [provider]  chlorpheniramine (CHLOR-TRIMETON) 4 MG tablet Take 4 mg by mouth every 4 (four) hours as needed for allergies.   Yes [provider]  diclofenac (VOLTAREN) 75 MG EC tablet Take 75 mg by mouth 2 (two) times daily as needed.   Yes [provider]  hyoscyamine (LEVSIN, ANASPAZ) 0.125 MG tablet Take 0.125 mg by mouth every 6 (six) hours as needed.   Yes [provider]  loperamide (IMODIUM A-D) 2 MG tablet Take 2 mg by mouth.   Yes [provider]  omeprazole (PRILOSEC OTC) 20 MG tablet Take 20 mg by mouth daily.   Yes [provider]  predniSONE (DELTASONE) 10 MG tablet Take  4 each am x 2 days,   2 each am x 2 days,  1 each am x 2 days and stop 06/27/17  Yes Nyoka Cowden, MD  Probiotic Product (PROBIOTIC DAILY PO) Take 1 tablet by mouth daily. Pt takes Librarian, academic   Yes [provider]  Respiratory Therapy Supplies (FLUTTER) DEVI Use as directed 08/23/14  Yes Nyoka Cowden, MD  traMADol (ULTRAM) 50 MG tablet 1-2 every 4 hours as needed for cough or pain  06/27/17  Yes Nyoka CowdenWert, Michael B, MD    Family History Family History  Problem Relation Age of Onset  . Heart disease Maternal Grandfather   . Kidney cancer Maternal Grandfather   . Asthma Daughter   . Breast cancer Paternal Aunt   . Brain cancer Paternal Grandmother     Social History Social History   Tobacco Use  . Smoking status: Never Smoker  . Smokeless tobacco: Never Used  Substance Use Topics  . Alcohol use: No    Alcohol/week: 0.0 oz  . Drug use: No     Allergies   Vicodin [hydrocodone-acetaminophen]   Review of Systems Review of Systems Ten systems reviewed and are negative for acute change, except as noted in the HPI.    Physical Exam Updated Vital Signs BP (!) 145/91 (BP  Location: Right Arm)   Pulse (!) 101   Temp 98.2 F (36.8 C) (Oral)   Resp 18   Ht 5\' 3"  (1.6 m)   Wt 99.3 kg (219 lb)   LMP 06/21/2017   SpO2 100%   BMI 38.79 kg/m   Physical Exam  Constitutional: She is oriented to person, place, and time. She appears well-developed and well-nourished. No distress.  Nontoxic appearing and in no acute distress  HENT:  Head: Normocephalic and atraumatic.  Eyes: Conjunctivae and EOM are normal. No scleral icterus.  Neck: Normal range of motion.  Cardiovascular: Normal rate, regular rhythm and intact distal pulses.  Pulmonary/Chest: Effort normal. No stridor. No respiratory distress. She has no wheezes.  Lungs clear to auscultation bilaterally.  No tenderness to the chest wall.  Abdominal:  Soft abdomen without focal tenderness.  Negative Murphy sign.  No guarding, masses, peritoneal signs.  Musculoskeletal: Normal range of motion.  Neurological: She is alert and oriented to person, place, and time. She exhibits normal muscle tone. Coordination normal.  Skin: Skin is warm and dry. No rash noted. She is not diaphoretic. No erythema. No pallor.  Psychiatric: She has a normal mood and affect. Her behavior is normal.  Nursing note and vitals reviewed.    ED Treatments / Results  Labs (all labs ordered are listed, but only abnormal results are displayed) Labs Reviewed  COMPREHENSIVE METABOLIC PANEL - Abnormal; Notable for the following components:      Result Value   Potassium 3.2 (*)    CO2 20 (*)    Glucose, Bld 107 (*)    All other components within normal limits  CBC - Abnormal; Notable for the following components:   WBC 15.2 (*)    Hemoglobin 15.1 (*)    All other components within normal limits  URINALYSIS, ROUTINE W REFLEX MICROSCOPIC - Abnormal; Notable for the following components:   APPearance CLOUDY (*)    Ketones, ur 20 (*)    All other components within normal limits  LIPASE, BLOOD  I-STAT BETA HCG BLOOD, ED ([facility], [facility], [facility] ONLY)     EKG  EKG Interpretation None       Radiology Dg Chest 2 View  Result Date: 07/12/2017 CLINICAL DATA:  Cough for 1 month.  History of pneumonia. EXAM: CHEST  2 VIEW COMPARISON:  Radiographs 11/04/2014 FINDINGS: The cardiomediastinal contours are normal. Minimal lingular scarring. Pulmonary vasculature is normal. No consolidation, pleural effusion, or pneumothorax. No acute osseous abnormalities are seen. IMPRESSION: Minimal lingular scarring. Otherwise normal radiographs of the chest. Electronically Signed   By: Rubye OaksMelanie  Ehinger M.D.   On: 07/12/2017 03:21    Procedures Procedures (including critical care  time)  Medications Ordered in ED Medications  gi cocktail (Maalox,Lidocaine,Donnatal) (30 mLs Oral Given 07/12/17 0530)     Initial Impression / Assessment and Plan / ED Course  I have reviewed the triage vital signs and the nursing notes.  Pertinent labs & imaging results that were available during my care of the patient were reviewed by me and considered in my medical decision making (see chart for details).     36 year old female presents for sudden onset of upper abdominal pain; initially epigastric, migrating to right upper quadrant.  Pain associated with nausea.  Symptoms have spontaneously improved.  Patient tried Gas-X prior to arrival and has been passing flatus.  No vomiting.  Workup with leukocytosis of 15 which is nonspecific.  This is thought less likely to be representative of infection as patient is afebrile.  He has a reassuring chest x-ray.  No signs of acute surgical abdomen; however, will obtain abdominal ultrasound to evaluate for possible gallbladder etiology/biliary colic.  Patient signed out to Ebbie Ridgehris Lawyer, PA-C at change of shift with US pending. GI cocktail ordered to see if it helps improve patient's pain.   Final Clinical Impressions(s) / ED Diagnoses   Final diagnoses:  Pain of upper abdomen    ED Discharge Orders    None       Antony MaduraHumes,  Meilani Edmundson, PA-C 07/12/17 0617    Shon BatonHorton, Courtney F, MD 07/12/17 [phone]

## 2017-07-13 ENCOUNTER — Other Ambulatory Visit (HOSPITAL_COMMUNITY): Payer: Self-pay | Admitting: Gastroenterology

## 2017-07-13 DIAGNOSIS — R1011 Right upper quadrant pain: Secondary | ICD-10-CM

## 2017-07-13 DIAGNOSIS — R1013 Epigastric pain: Secondary | ICD-10-CM

## 2017-07-14 ENCOUNTER — Encounter: Payer: Self-pay | Admitting: Internal Medicine

## 2017-07-14 DIAGNOSIS — R05 Cough: Secondary | ICD-10-CM

## 2017-07-14 DIAGNOSIS — R059 Cough, unspecified: Secondary | ICD-10-CM

## 2017-07-15 NOTE — Telephone Encounter (Signed)
Xray was fine so next step is sinus CT limited to sort out cause for cough Also needs cbc with diff and allergy profile repeated (last done 2016) and ov with all active meds in hand - see if can add on to Tammy NP's schedule am 12/24 for this

## 2017-07-15 NOTE — Telephone Encounter (Signed)
MW please advise.  Thanks.  

## 2017-07-20 ENCOUNTER — Ambulatory Visit: Payer: Managed Care, Other (non HMO) | Admitting: Internal Medicine

## 2017-07-20 ENCOUNTER — Encounter: Payer: Self-pay | Admitting: Internal Medicine

## 2017-07-20 ENCOUNTER — Ambulatory Visit (HOSPITAL_COMMUNITY)
Admission: RE | Admit: 2017-07-20 | Discharge: 2017-07-20 | Disposition: A | Discharge status: Home / Self Care | Payer: Managed Care, Other (non HMO) | Source: Ambulatory Visit | Attending: Gastroenterology | Admitting: Gastroenterology

## 2017-07-20 ENCOUNTER — Telehealth: Payer: Self-pay | Admitting: Internal Medicine

## 2017-07-20 ENCOUNTER — Other Ambulatory Visit (INDEPENDENT_AMBULATORY_CARE_PROVIDER_SITE_OTHER): Payer: Managed Care, Other (non HMO)

## 2017-07-20 VITALS — BP 122/62 | HR 95 | Ht 63.0 in | Wt 218.2 lb

## 2017-07-20 DIAGNOSIS — R1013 Epigastric pain: Secondary | ICD-10-CM | POA: Insufficient documentation

## 2017-07-20 DIAGNOSIS — J45991 Cough variant asthma: Secondary | ICD-10-CM

## 2017-07-20 DIAGNOSIS — R1011 Right upper quadrant pain: Secondary | ICD-10-CM | POA: Insufficient documentation

## 2017-07-20 DIAGNOSIS — R059 Cough, unspecified: Secondary | ICD-10-CM

## 2017-07-20 DIAGNOSIS — R05 Cough: Secondary | ICD-10-CM

## 2017-07-20 LAB — CBC WITH DIFFERENTIAL/PLATELET
BASOS ABS: 0 10*3/uL (ref 0.0–0.1)
Basophils Relative: 0.5 % (ref 0.0–3.0)
EOS ABS: 0.2 10*3/uL (ref 0.0–0.7)
Eosinophils Relative: 2.5 % (ref 0.0–5.0)
HEMATOCRIT: 43.2 % (ref 36.0–46.0)
HEMOGLOBIN: 14.3 g/dL (ref 12.0–15.0)
LYMPHS PCT: 31.4 % (ref 12.0–46.0)
Lymphs Abs: 2.1 10*3/uL (ref 0.7–4.0)
MCHC: 33.2 g/dL (ref 30.0–36.0)
MCV: 89.3 fl (ref 78.0–100.0)
MONOS PCT: 5.4 % (ref 3.0–12.0)
Monocytes Absolute: 0.4 10*3/uL (ref 0.1–1.0)
Neutro Abs: 3.9 10*3/uL (ref 1.4–7.7)
Neutrophils Relative %: 60.2 % (ref 43.0–77.0)
PLATELETS: 257 10*3/uL (ref 150.0–400.0)
RBC: 4.84 Mil/uL (ref 3.87–5.11)
RDW: 14.6 % (ref 11.5–15.5)
WBC: 6.5 10*3/uL (ref 4.0–10.5)

## 2017-07-20 MED ORDER — AMOXICILLIN-POT CLAVULANATE 875-125 MG PO TABS: 1.0000 | ORAL_TABLET | Freq: Two times a day (BID) | ORAL | 0 refills | Status: DC

## 2017-07-20 MED ORDER — AMOXICILLIN-POT CLAVULANATE 875-125 MG PO TABS: 1.0000 | ORAL_TABLET | Freq: Two times a day (BID) | ORAL | 1 refills | Status: DC

## 2017-07-20 MED ORDER — FAMOTIDINE 20 MG PO TABS: ORAL_TABLET | ORAL | 2 refills | Status: AC

## 2017-07-20 MED ORDER — BECLOMETHASONE DIPROPIONATE 80 MCG/ACT IN AERS: 2.0000 | INHALATION_SPRAY | Freq: Two times a day (BID) | RESPIRATORY_TRACT | 2 refills | Status: DC

## 2017-07-20 MED ORDER — PANTOPRAZOLE SODIUM 40 MG PO TBEC: 40.0000 mg | DELAYED_RELEASE_TABLET | Freq: Every day | ORAL | 2 refills | Status: AC

## 2017-07-20 MED ORDER — BUDESONIDE-FORMOTEROL FUMARATE 80-4.5 MCG/ACT IN AERO: 2.0000 | INHALATION_SPRAY | Freq: Two times a day (BID) | RESPIRATORY_TRACT | 2 refills | Status: AC

## 2017-07-20 MED ORDER — TECHNETIUM TC 99M MEBROFENIN IV KIT
5.3000 | PACK | Freq: Once | INTRAVENOUS | Status: AC | PRN
  Administered 2017-07-20: 5.3 via INTRAVENOUS

## 2017-07-20 NOTE — Patient Instructions (Addendum)
Pantoprazole (protonix) 40 mg   Take  30-60 min before first meal of the day and Pepcid (famotidine)  20 mg one @  bedtime until return to office - this is the best way to tell whether stomach acid is contributing to your problem.    symbicort 80 Take 2 puffs first thing in am and then another 2 puffs about 12 hours later.   Work on inhaler technique:  relax and gently blow all the way out then take a nice smooth deep breath back in, triggering the inhaler at same time you start breathing in.  Hold for up to 5 seconds if you can. Blow out thru nose. Rinse and gargle with water when done     Augmentin 875 mg take one pill twice daily  X 10 days - take at breakfast and supper with large glass of water.  It would help reduce the usual side effects (diarrhea and yeast infections) if you ate cultured yogurt at lunch.    Take delsym two tsp every 12 hours and supplement if needed with  tramadol 50 mg up to 2 every 4 hours to suppress the urge to cough. Swallowing water or using ice chips/non mint and menthol containing candies (such as lifesavers or sugarless jolly ranchers) are also effective.  You should rest your voice and avoid activities that you know make you cough.  Once you have eliminated the cough for 3 straight days try reducing the tramadol first,  then the delsym as tolerated.     .Please remember to go to the lab department downstairs in the basement  for your tests - we will call you with the results when they are available.

## 2017-07-20 NOTE — Telephone Encounter (Signed)
This was a mistake, Rx sent for quant of 20 pills for a 10 day supply. Nothing further is needed.   Augmentin 875 mg take one pill twice daily  X 10 days - take at breakfast and supper with large glass of water.  It would help reduce the usual side effects (diarrhea and yeast infections) if you ate cultured yogurt at lunch.    Take delsym two tsp every 12 hours and supplement if needed with  tramadol 50 mg up to 2 every 4 hours to suppress the urge to cough. Swallowing water or using ice chips/non mint and menthol containing candies (such as lifesavers or sugarless jolly ranchers) are also effective.  You should rest your voice and avoid activities that you know make you cough.  Once you have eliminated the cough for 3 straight days try reducing the tramadol first,  then the delsym as tolerated.     .Please remember to go to the lab department downstairs in the basement  for your tests - we will call you with the results when they are available.

## 2017-07-20 NOTE — Progress Notes (Signed)
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 Pulmonary Consult    Referred by Dr. Josie Brock. Pt c/o cough for the past 2 months- prod at times with yellow to green sputum. She also c/o DOE with walking up stairs. She had PNA Nov 2014.   Multiple abx and steroids since abrupt  onset 2 x months never improved but  better while sleeping then after stirs in am much worse within 30 min regardless of activity  Assoc with urinary incont, sob mostly with cough rec Augmentin 875 mg take one pill twice daily  X 10 days - take at breakfast and supper with large glass of water.  It would help reduce the usual side effects (diarrhea and yeast infections) if you ate cultured yogurt at lunch.  Please see patient coordinator before you leave today  to schedule sinus ct> sphenoid sinusitis/ bubbly secrections > extended augmentinto 20 days rx First take delsym two tsp every 12 hours and supplement if needed with  tramadol 50 mg up to 2 every 4 hours     Prednisone 10 mg take  4 each am x 2 days,   2 each am x 2 days,  1 each am x 2 days and stop (this is to eliminate allergies and inflammation from coughing) Protonix (pantoprazole) Take 30-60 min before first meal of the day and Pepcid 20 mg one bedtime plus chlorpheniramine 4 mg x 2 at bedtime (both available over the counter)  until cough is completely gone for at least a week without the need for cough suppression GERD diet   08/23/2014 f/u  ov/Susan Brock re: cough / ? Lingular atx  Chief Complaint  Patient presents with  . Follow-up    CXR done today. Cough has improved some. Still has some occ light green sputum. Her chest still feels tight at times and breathing not completely back to baseline yet. She has not used rescue inhaler in the past wk.   Most green mucus is in am/ has one more week of augmentin  Not using ventolin this week  rec dulera 100 Take 2 puffs first thing in am and then another 2 puffs about 12 hours later.  Only use your albuterol as a rescue medication Finish antibiotics  Check with your insurance formulary re best option for protonix alternative  Use the flutter valve as much as you can        09/23/2014 f/u ov/Susan Brock re: cough since Nov 2015 ? Cough variant asthma? Vs uacs  Chief Complaint  Patient presents with  . Follow-up    Pt states her cough is some better. Chest does not feel tight anymore. She has not had to use rescue inhaler since her last Brock. No new co's today.   cough was green now minimal yellow, some p stir p stirs in am  Stopped singulair on her own x 3 weeks as did  not think it helped  dulera 100 2bid  rec Please see patient coordinator before you leave today  to schedule sinus CT and we will arrange referral to Dr Susan Brock if needed Continue PPI in the form of prilosec otc 20 mg Take 30- 60 min before your first and last meals of the day until 100% better, then try  Continue dulera 100 Take 2 puffs first thing in am and then another 2 puffs about 12 hours late    11/04/2014 f/u ov/Susan Brock re: lingular atx /cough variant asthma Chief Complaint  Patient presents with  . Follow-up    Pt states cough was some worse for the past several days- relates to spending time outside. Cough is non prod. She has stopped acid suppresion and no longer taking cough suppressants.   stopped dulera sev weeks prior to OV  And also no longer on gerd rx/ just zyrtec. Minimal Cough day > noct, dry/ no need  For  saba  rec For any cough flare 1st retart dulera and if not rapidly improving add the reflux medications back Continue zyrtec as needed      10/09/2015 acute extended ov/Susan Brock re: recurrent cough / maint on zyrtec year round despite neg allergy profile  Chief Complaint  Patient presents with  . Acute Brock    Pt c/o sore throat and sinus drainage x 4 days. She started coughing 1 day ago- non prod. Her chest feels tight if she takes a deep breath.   daughter sick then onset of nasal symptoms and sorethroat acutely a few days later with watery rhinitis and sensation of stuffy head even after multiple otc's including various forms of pseuodfed rec Augmentin 875 mg take one pill twice daily  X 10 days - take at breakfast and supper with large glass of water.  It would help reduce the usual side effects (diarrhea and yeast infections) if you ate cultured yogurt at lunch.  First take delsym two tsp every 12 hours and use your flutter valve and supplement if needed with  tramadol 50 mg up to 2 every 4 hours to suppress the urge to cough at all or even clear your throat. Swallowing water or using ice chips/non mint and menthol containing candies (such as lifesavers or sugarless jolly ranchers) are also effective.  You should rest your voice and avoid activities that you know make you cough. Once you have eliminated the cough for 3 straight days try reducing the tramadol first,  then the delsym as tolerated.   Prilosec Take 30-60 min before first meal of the day and Pepcid 20 mg one bedtime plus chlorpheniramine 4 mg x 2 at bedtime (both available over the counter)  until cough is completely gone for at least a week without the need for cough suppression GERD  Hold on dulera unless you wheeze/ short of breath > up to 2 pffs every 12 hours  For stuffy head > advil cold and sinus as per bottle  For runny nose / itching / sneezing > zyrtec daily  Return if not all better in 2 weeks      06/27/2017  f/u  ov/Susan Brock re: recurrent cough  Chief Complaint  Patient presents with  . Acute Brock    Pt states started having chest tightness 5 days ago, then wheezing and cough started 3 days ago. Cough has been non prod. She had some chills today, but no fever.    did fine for over a year on just zyrtec  2  weeks prior to OV  Sore throat/ nasal congestion green mucus > no  treatment  Then 06/23/2017 chest tight and had no inhalers so started prilosec ac once a day then chlor Has flutter but can't find it  Cough is worse at hs and first thing in am but sleeps fine flat Only  A  little better p daughter's albuterol rec Augmentin 875 mg take one pill twice daily  X 10 days -  .  First take delsym two tsp every 12 hours and use your flutter valve and supplement if needed with  tramadol 50 mg up to 2 every 4 hours  .   Prilosec 20 mg x 2  Take 30-60 min before first meal of the day and Pepcid 20 mg one bedtime plus chlorpheniramine 4 mg x 2 at bedtime (both available over the counter)    For stuffy head > advil cold and sinus as per bottle  For runny nose / itching / sneezing >  zyrtec daily      07/20/2017  f/u ov/Swayzee Wadley re: refractory uacs since mid Nov 2018/ dd not follow instructions re cough elimination  Chief Complaint  Patient presents with  . Follow-up    cough w/ yellow/green sputum, runny nose, chest congestion, head congestion,ears feel stopped up  cough worse at hs and after stirs in am but not noct p hs tramadol  Only taking tramadol at hs  Not limited by breathing from desired activities     No obvious day to day or daytime variability or assoc excess/ purulent sputum or mucus plugs or hemoptysis or cp or chest tightness, subjective wheeze or overt   hb symptoms. No unusual exposure hx or h/o childhood pna/ asthma or knowledge of premature birth.  Sleeping ok flat without nocturnal  or early am exacerbation  of respiratory  c/o's or need for noct saba. Also denies any obvious fluctuation of  symptoms with weather or environmental changes or other aggravating or alleviating factors except as outlined above   Current Allergies, Complete Past Medical History, Past Surgical History, Family History, and Social History were reviewed in [employer] record.  ROS  The following are not active complaints unless bolded Hoarseness, sore throat, dysphagia, dental problems, itching, sneezing,  nasal congestion or discharge of excess mucus or purulent secretions, ear ache,   fever, chills, sweats, unintended wt loss or wt gain, classically pleuritic or exertional cp,  orthopnea pnd or leg swelling, presyncope, palpitations, abdominal pain, anorexia, nausea, vomiting, diarrhea  or change in bowel habits or change in bladder habits, change in stools or change in urine, dysuria, hematuria,  rash, arthralgias, visual complaints, headache, numbness, weakness or ataxia or problems with walking or coordination,  change in mood/affect or memory.        Current Meds  Medication Sig  . albuterol (PROVENTIL HFA;VENTOLIN HFA) 108 (90 BASE) MCG/ACT inhaler Inhale 1-2 puffs into the lungs every 6 (six) hours as needed for wheezing or shortness of breath.  Marland Kitchen amoxicillin-clavulanate (AUGMENTIN) 875-125 MG tablet Take 1 tablet by mouth 2 (two) times daily. Started on 06-27-17 for 10 day supply. Patient completed course on 07-07-17  . Ascorbic Acid (VITAMIN C) 1000 MG tablet Take 1,000 mg by mouth daily.  . cetirizine (ZYRTEC) 10 MG tablet Take 10 mg by mouth daily.  . diclofenac (VOLTAREN) 75 MG EC tablet Take 75 mg by mouth 2 (two) times daily as needed.  . hyoscyamine (LEVSIN, ANASPAZ) 0.125 MG tablet Take 0.125 mg by  mouth every 6 (six) hours as needed.  . loperamide (IMODIUM A-D) 2 MG tablet Take 2 mg by mouth.  . Probiotic Product (PROBIOTIC DAILY PO) Take 1 tablet by mouth daily. Pt takes [employer], [employer]  . Respiratory Therapy Supplies (FLUTTER) DEVI Use as directed  . traMADol (ULTRAM) 50 MG  tablet Take 1 tablet (50 mg total) by mouth every 6 (six) hours as needed for severe pain.        . [  chlorpheniramine (CHLOR-TRIMETON) 4 MG tablet Take 4 mg by mouth every 4 (four) hours as needed for allergies.  . [ omeprazole (PRILOSEC OTC) 20 MG tablet Take 20 mg by mouth daily.  . [DISCONTINUED] predniSONE (DELTASONE) 10 MG tablet Take  4 each am x 2 days,   2 each am x 2 days,  1 each am x 2 days and stop                               Objective:   Physical Exam   amb wf barking cough / no flutter valve in hand   .11/04/2014        230  > 10/09/2015  222 >  07/20/2017 218  09/23/14 228 lb (103.42 kg)  08/23/14 226 lb (102.513 kg)  08/08/14 227 lb 12.8 oz (103.329 kg)      Vital signs reviewed - Note on arrival 02 sats  99% on RA     HEENT: nl dentition, turbinates bilaterally, and oropharynx. Nl external ear canals without cough reflex   NECK :  without JVD/Nodes/TM/ nl carotid upstrokes bilaterally   LUNGS: no acc muscle use,  Nl contour chest which is clear to A and P bilaterally with urge to cough at end  exp maneuvers   CV:  RRR  no s3 or murmur or increase in P2, and no edema   ABD:  soft and nontender with nl inspiratory excursion in the supine position. No bruits or organomegaly appreciated, bowel sounds nl  MS:  Nl gait/ ext warm without deformities, calf tenderness, cyanosis or clubbing No obvious joint restrictions   SKIN: warm and dry without lesions    NEURO:  alert, approp, nl sensorium with  no motor or cerebellar deficits apparent.          Allergy profile 07/20/2017 >  Eos 0. /  IgE      Assessment & Plan:

## 2017-07-21 ENCOUNTER — Ambulatory Visit (INDEPENDENT_AMBULATORY_CARE_PROVIDER_SITE_OTHER)
Admission: RE | Admit: 2017-07-21 | Discharge: 2017-07-21 | Disposition: A | Discharge status: Home / Self Care | Payer: Managed Care, Other (non HMO) | Source: Ambulatory Visit | Attending: Internal Medicine | Admitting: Internal Medicine

## 2017-07-21 DIAGNOSIS — R059 Cough, unspecified: Secondary | ICD-10-CM

## 2017-07-21 DIAGNOSIS — R05 Cough: Secondary | ICD-10-CM

## 2017-07-21 LAB — RESPIRATORY ALLERGY PROFILE REGION II ~~LOC~~
Allergen, Cedar tree, t12: <0.1 kU/L
Allergen, Cottonwood, t14: <0.1 kU/L
Allergen, D pternoyssinus,d7: <0.1 kU/L
Allergen, Mouse Urine Protein, e78: <0.1 kU/L
Allergen, Mulberry, t76: <0.1 kU/L
Allergen, Oak,t7: <0.1 kU/L
Allergen, P. notatum, m1: <0.1 kU/L
Aspergillus fumigatus, m3: <0.1 kU/L
Bermuda Grass: <0.1 kU/L
Box Elder IgE: <0.1 kU/L
CLASS: 0
CLASS: 0
CLASS: 0
CLASS: 0
CLASS: 0
CLASS: 0
CLASS: 0
CLASS: 0
CLASS: 0
CLASS: 0
CLASS: 0
CLASS: 0
CLASS: 0
CLASS: 0
Cat Dander: <0.1 kU/L
Class: 0
Class: 0
Class: 0
Class: 0
Class: 0
Class: 0
Class: 0
Class: 0
Class: 0
Class: 0
Cockroach: <0.1 kU/L
D. farinae: <0.1 kU/L
Dog Dander: <0.1 kU/L
Elm IgE: <0.1 kU/L
IGE (IMMUNOGLOBULIN E), SERUM: 95 kU/L (ref <=114)
Rough Pigweed  IgE: <0.1 kU/L
Sheep Sorrel IgE: <0.1 kU/L
Timothy Grass: <0.1 kU/L

## 2017-07-21 LAB — INTERPRETATION:

## 2017-07-27 ENCOUNTER — Encounter: Payer: Self-pay | Admitting: Internal Medicine

## 2017-07-27 NOTE — Assessment & Plan Note (Addendum)
-   allergy profile 08/08/2014 >  IgE 33 with neg RAST,  Eos 0.1 - sinus CT 08/15/2014 > bubbly sphenoid sinusitis > rec complete 20 total days augmentin  - added flutter valve 08/23/14 - add dulera 100 08/23/14  - stopped singulair around 09/04/14 no worse  - sinus CT 09/24/2014 > neg  - stopped dulera end of 09/2014 and well as gerd Rx - Flare 10/09/2015  rx as uacs /sinusitis  - FENO 06/27/2017  =   24 during flare - added flutter valve 06/27/2017   - Allergy profile 07/20/2017 >  Eos 0.2/  IgE 95 RAST neg - sinus CT ordered    Lack of cough resolution on a verified empirical regimen (which we have not done here) could mean an alternative diagnosis, persistence of the disease state (eg sinusitis or bronchiectasis) , or inadequacy of currently available therapy (eg no medical rx available for non-acid gerd)   I still strongly favor cyclical cough here though purulent sputum reported in am is worrisome for sinusitis and end exp cough suggests asthmatic component or bronchiectasis so rec   augmentin x 10 days - extend to 20 if pos sinus ct Consider HRCT if remains refractory Elimination of cyclical cough with flutter/ tramadol x min of 72 h Start symbicort 80 2bid  If not clear as to cause may need fob for eval of eos bronchitis and MCT/ then trial of gabapentin in that order  - The proper method of use, as well as anticipated side effects, of a metered-dose inhaler are discussed and demonstrated to the patient. Improved effectiveness after extensive coaching during this visit to a level of approximately 75 % from a baseline of 50 %     I had an extended discussion with the patient reviewing all relevant studies completed to date and  lasting 25 minutes of a 40  minute office  visit addressing  severe non-specific but potentially very serious refractory respiratory symptoms of uncertain and potentially multiple  etiologies.   Each maintenance medication was reviewed in detail including most  importantly the difference between maintenance and prns and under what circumstances the prns are to be triggered using an action plan format that is not reflected in the computer generated alphabetically organized AVS.    Please see AVS for specific instructions unique to this office visit that I personally wrote and verbalized to the the pt in detail and then reviewed with pt  by my nurse highlighting any changes in therapy/plan of care  recommended at today's visit.

## 2019-09-06 ENCOUNTER — Other Ambulatory Visit: Payer: Managed Care, Other (non HMO)

## 2019-09-07 ENCOUNTER — Other Ambulatory Visit: Payer: Managed Care, Other (non HMO)

## 2020-06-16 ENCOUNTER — Ambulatory Visit (INDEPENDENT_AMBULATORY_CARE_PROVIDER_SITE_OTHER): Payer: 59 | Admitting: Adult Health

## 2020-06-16 ENCOUNTER — Telehealth: Payer: Self-pay | Admitting: Internal Medicine

## 2020-06-16 ENCOUNTER — Encounter: Payer: Self-pay | Admitting: Adult Health

## 2020-06-16 DIAGNOSIS — J069 Acute upper respiratory infection, unspecified: Secondary | ICD-10-CM

## 2020-06-16 MED ORDER — ALBUTEROL SULFATE HFA 108 (90 BASE) MCG/ACT IN AERS: 1.0000 | INHALATION_SPRAY | Freq: Four times a day (QID) | RESPIRATORY_TRACT | 1 refills | Status: AC | PRN

## 2020-06-16 MED ORDER — AZITHROMYCIN 250 MG PO TABS: ORAL_TABLET | ORAL | 0 refills | Status: AC

## 2020-06-16 NOTE — Telephone Encounter (Signed)
Last seen 2018   Will need ov for evaluation   Can put on my schedule this am for televisit   Please contact office for sooner follow up if symptoms do not improve or worsen or seek emergency care

## 2020-06-16 NOTE — Progress Notes (Signed)
Virtual Visit via Telephone Note  I connected with Susan Brock on 06/16/20 at  9:30 AM EST by telephone and verified that I am speaking with the correct person using two identifiers.  Location: Patient: Home  Provider: Home Office    I discussed the limitations, risks, security and privacy concerns of performing an evaluation and management service by telephone and the availability of in person appointments. I also discussed with the patient that there may be a patient responsible charge related to this service. The patient expressed understanding and agreed to proceed.   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 Last seen in 2018. Complains of 3 days of nasal congestion, cough, congestion , low grade fever. No significant wheezing . Had been working in yard with leaves. Symptoms worse today with harsh cough .  Not on maintenance inhalers . Does not have albuterol inhaler any longer.  Not vaccinated for Covid 19. .  Husband was sick last week, he was negative for flu, covid and strep.  Takes Zyrtec everyday . Started Dayquil and Nyquil with some help.  Appetite is good, denies n/v/d.  Says she has been doing well with no flares since last visit until this last couple of days.    Observations/Objective: Speaks in full sentences with no audible distress or wheezing.    allergy profile 08/08/2014 >  IgE 33 with neg RAST,  Eos 0.1 - sinus CT 08/15/2014 > bubbly sphenoid sinusitis > rec complete 20 total days augmentin  - added flutter valve 08/23/14 - add dulera 100 08/23/14  - stopped singulair around 09/04/14 no worse  - sinus CT 09/24/2014 > neg  - stopped dulera end of 09/2014 and well as gerd Rx - Flare 10/09/2015  rx as uacs /sinusitis  - FENO 06/27/2017  =   24 during flare - added flutter valve 06/27/2017  - Allergy profile 07/20/2017 >  Eos 0.2/  IgE 95 RAST neg -  sinus CT ordered    Assessment and Plan: Acute URI /Rhinitis flare -  Recommend Covid 19 testing , quarantine until results are available .   Plan  Please get COVID-[facility] at [facility] Z[facility]Pak to have on hold if symptoms worsen with discolored mucus Mucinex DM twice daily as needed for cough and congestion Saline nasal rinses twice daily as needed Continue Zyrtec 10 mg daily Albuterol inhaler 1 to 2 puffs every 6 hours as needed for wheezing Follow-up with Dr. Sherene Sires in 3 months and as needed  Please contact office for sooner follow up if symptoms do not improve or worsen or seek emergency care    Follow Up Instructions: Follow-up in 3 months and as needed Please contact office for sooner follow up if symptoms do not improve or worsen or seek emergency care     I discussed the assessment and treatment plan with the patient. The patient was provided an opportunity to ask questions and all were answered. The patient agreed with the plan and demonstrated an understanding of the instructions.   The patient was advised to call back or seek an in-person evaluation if the symptoms worsen or if the condition fails to improve as anticipated.  I provided 22 minutes of non-face-to-face time during this encounter.   Rubye Oaks, NP

## 2020-06-16 NOTE — Telephone Encounter (Signed)
Placed to TP's scheduled.  Let patient know.  She said 0930 would work for her Nothing further needed at this time.

## 2020-06-16 NOTE — Telephone Encounter (Signed)
Spoke with the pt  She is c/o cough with white to clear sputum and sinus congestion, PND x 2 days  She denies any SOB, wheezing chest tightness  She did have a temp on 100.6 yesterday but not today  No chills or aches  She has NOT been vaccinated against covid  She is requesting appt  Please advise, thanks!

## 2020-06-16 NOTE — Patient Instructions (Signed)
Please get [facility] to have on hold if symptoms worsen with discolored mucus Mucinex DM twice daily as needed for cough and congestion Saline nasal rinses twice daily as needed Continue Zyrtec 10 mg daily Albuterol inhaler 1 to 2 puffs every 6 hours as needed for wheezing Follow-up with Dr. Sherene Sires in 3 months and as needed  Please contact office for sooner follow up if symptoms do not improve or worsen or seek emergency care

## 2020-06-17 NOTE — Telephone Encounter (Signed)
TP - please advise. Thanks. 

## 2020-06-17 NOTE — Telephone Encounter (Signed)
Covid Positive , will refer to the MAB team .  Patient qualifies with BMI /Asthma , day 4 of symptoms   Called patient and advised to continue with office visit recs from ov yesterday .  Advised to watch out for red flags for COVID -go to ER if develp Quarantine for 10 days from symptoms   Please contact office for sooner follow up if symptoms do not improve or worsen or seek emergency care

## 2020-06-18 ENCOUNTER — Ambulatory Visit (HOSPITAL_COMMUNITY)
Admission: RE | Admit: 2020-06-18 | Discharge: 2020-06-18 | Disposition: A | Discharge status: Home / Self Care | Payer: 59 | Source: Ambulatory Visit | Attending: Pulmonary Disease | Admitting: Pulmonary Disease

## 2020-06-18 ENCOUNTER — Other Ambulatory Visit: Payer: Self-pay | Admitting: Nurse Practitioner

## 2020-06-18 ENCOUNTER — Telehealth: Payer: Self-pay | Admitting: Adult Health

## 2020-06-18 DIAGNOSIS — J45991 Cough variant asthma: Secondary | ICD-10-CM

## 2020-06-18 DIAGNOSIS — U071 COVID-19: Secondary | ICD-10-CM | POA: Diagnosis not present

## 2020-06-18 MED ORDER — SODIUM CHLORIDE 0.9 % IV SOLN: INTRAVENOUS | Status: DC | PRN

## 2020-06-18 MED ORDER — EPINEPHRINE 0.3 MG/0.3ML IJ SOAJ: 0.3000 mg | Freq: Once | INTRAMUSCULAR | Status: DC | PRN

## 2020-06-18 MED ORDER — ALBUTEROL SULFATE HFA 108 (90 BASE) MCG/ACT IN AERS: 2.0000 | INHALATION_SPRAY | Freq: Once | RESPIRATORY_TRACT | Status: DC | PRN

## 2020-06-18 MED ORDER — FAMOTIDINE IN NACL 20-0.9 MG/50ML-% IV SOLN: 20.0000 mg | Freq: Once | INTRAVENOUS | Status: DC | PRN

## 2020-06-18 MED ORDER — METHYLPREDNISOLONE SODIUM SUCC 125 MG IJ SOLR: 125.0000 mg | Freq: Once | INTRAMUSCULAR | Status: DC | PRN

## 2020-06-18 MED ORDER — SOTROVIMAB 500 MG/8ML IV SOLN
500.0000 mg | Freq: Once | INTRAVENOUS | Status: AC
  Administered 2020-06-18: 500 mg via INTRAVENOUS

## 2020-06-18 MED ORDER — DIPHENHYDRAMINE HCL 50 MG/ML IJ SOLN: 50.0000 mg | Freq: Once | INTRAMUSCULAR | Status: DC | PRN

## 2020-06-18 NOTE — Progress Notes (Signed)
Patient reviewed Fact Sheet for Patients, Parents, and Caregivers for Emergency Use Authorization (EUA) of Sotrovimab for the Treatment of Coronavirus. Patient also reviewed and is agreeable to the estimated cost of treatment. Patient is agreeable to proceed.    Grayland Daisey R Bettey Muraoka, RN  

## 2020-06-18 NOTE — Telephone Encounter (Signed)
They sent message that she is on the list should be calling her this morning , if not heard from them at lunchtime call us back. Can give her the infusion hotline number to call if she wants to double check

## 2020-06-18 NOTE — Telephone Encounter (Signed)
Called and spoke with pt and she is aware. If she has not heard back from them she will call us back around lunch time.

## 2020-06-18 NOTE — Telephone Encounter (Signed)
TP pt stated that she has not heard from the covid infusion clinic. Should we give her the number to call or should we send another note to them so they can get her scheduled?

## 2020-06-18 NOTE — Progress Notes (Signed)
Diagnosis: COVID-19  Physician: Dr. Patrick Wright  Procedure: Covid Infusion Clinic Med: Sotrovimab infusion - Provided patient with sotrovimab fact sheet for patients, parents, and caregivers prior to infusion.   Complications: No immediate complications noted  Discharge: Discharged home  If after the infusion you have any questions or concerns please call the Advanced Practice Provider at [phone] 

## 2020-06-18 NOTE — Progress Notes (Signed)
I connected by phone with Susan Brock on 06/18/2020 at 1:28 PM to discuss the potential use of a new treatment for mild to moderate COVID-19 viral infection in non-hospitalized patients.  This patient is a 39 y.o. female that meets the FDA criteria for Emergency Use Authorization of COVID monoclonal antibody casirivimab/imdevimab, bamlanivimab/eteseviamb, or sotrovimab.  Has a (+) direct SARS-CoV-2 viral test result  Has mild or moderate COVID-19   Is NOT hospitalized due to COVID-19  Is within 10 days of symptom onset  Has at least one of the high risk factor(s) for progression to severe COVID-19 and/or hospitalization as defined in EUA.  Specific high risk criteria : BMI > 25 and Chronic Lung Disease   I have spoken and communicated the following to the patient or parent/caregiver regarding COVID monoclonal antibody treatment:  1. FDA has authorized the emergency use for the treatment of mild to moderate COVID-19 in adults and pediatric patients with positive results of direct SARS-CoV-2 viral testing who are 71 years of age and older weighing at least 40 kg, and who are at high risk for progressing to severe COVID-19 and/or hospitalization.  2. The significant known and potential risks and benefits of COVID monoclonal antibody, and the extent to which such potential risks and benefits are unknown.  3. Information on available alternative treatments and the risks and benefits of those alternatives, including clinical trials.  4. Patients treated with COVID monoclonal antibody should continue to self-isolate and use infection control measures (e.g., wear mask, isolate, social distance, avoid sharing personal items, clean and disinfect "high touch" surfaces, and frequent handwashing) according to CDC guidelines.   5. The patient or parent/caregiver has the option to accept or refuse COVID monoclonal antibody treatment.  After reviewing this information with the patient, the  patient has agreed to receive one of the available covid 19 monoclonal antibodies and will be provided an appropriate fact sheet prior to infusion. Mayra Reel, NP 06/18/2020 1:28 PM

## 2020-06-18 NOTE — Discharge Instructions (Signed)

## 2021-12-07 ENCOUNTER — Ambulatory Visit
Admission: RE | Admit: 2021-12-07 | Discharge: 2021-12-07 | Disposition: A | Discharge status: Home / Self Care | Payer: No Typology Code available for payment source | Source: Ambulatory Visit | Attending: Family Medicine | Admitting: Family Medicine

## 2021-12-07 VITALS — BP 135/88 | HR 97 | Temp 98.0°F | Resp 12

## 2021-12-07 DIAGNOSIS — J029 Acute pharyngitis, unspecified: Secondary | ICD-10-CM | POA: Insufficient documentation

## 2021-12-07 LAB — POCT MONO SCREEN (KUC): Mono, POC: NEGATIVE

## 2021-12-07 LAB — POCT RAPID STREP A (OFFICE): Rapid Strep A Screen: NEGATIVE

## 2021-12-07 MED ORDER — KETOROLAC TROMETHAMINE 30 MG/ML IJ SOLN
30.0000 mg | Freq: Once | INTRAMUSCULAR | Status: AC
  Administered 2021-12-07: 30 mg via INTRAMUSCULAR

## 2021-12-07 MED ORDER — IBUPROFEN 800 MG PO TABS: 800.0000 mg | ORAL_TABLET | Freq: Three times a day (TID) | ORAL | 0 refills | Status: AC | PRN

## 2021-12-07 NOTE — ED Provider Notes (Signed)
?[facility] ? ? ? ?CSN: [id number] ?Arrival date & time: 12/07/21  0944 ? ? ?  ? ?History   ?Chief Complaint ?Chief Complaint  ?Patient presents with  ? Sore Throat  ?  Entered by patient  ? ? ?HPI ?Susan Brock is a 41 y.o. female.  ? ? ?Sore Throat ? ?Here for sore throat that began about 5/12. Some fever to 100.0. Mild nasal congestion and a little cough. No known exposures. No v/d.  ? ?Ibuprofen 400 mg helped a little. Chloroseptic burned when she tried it. ? ?Past Medical History:  ?Diagnosis Date  ? Allergy to mold   ? Pneumonia 2014  ? Seasonal allergies   ? ? ?Patient Active Problem List  ? Diagnosis Date Noted  ? 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 CAP (community acquired pneumonia) 08/08/2014  ? ? ?Past Surgical History:  ?Procedure Laterality Date  ? TONSILLECTOMY  1990  ? ? ?OB History   ?No obstetric history on file. ?  ? ? ? ?Home Medications   ? ?Prior to Admission medications   ?Medication Sig Start Date End Date Taking? Authorizing Provider  ?chlorthalidone (HYGROTON) 25 MG tablet chlorthalidone 25 mg tablet 09/17/21  Yes [provider]  ?ibuprofen (ADVIL) 800 MG tablet Take 1 tablet (800 mg total) by mouth every 8 (eight) hours as needed (pain). 12/07/21  Yes Zenia ResidesBanister, Maxximus Gotay K, MD  ?albuterol (VENTOLIN HFA) 108 (90 Base) MCG/ACT inhaler Inhale 1-2 puffs into the lungs every 6 (six) hours as needed for wheezing or shortness of breath. 06/16/20   Parrett, Virgel Bouquetammy S, NP  ?Ascorbic Acid (VITAMIN C) 1000 MG tablet Take 1,000 mg by mouth daily.    [provider]  ?budesonide-formoterol (SYMBICORT) 80-4.5 MCG/ACT inhaler Inhale 2 puffs into the lungs 2 (two) times daily. ?Patient not taking: Reported on 12/07/2021 07/20/17   Nyoka CowdenWert, Michael B, MD  ?cetirizine (ZYRTEC) 10 MG tablet Take 10 mg by mouth daily.    [provider]  ?famotidine (PEPCID) 20 MG tablet One at bedtime ?Patient not taking: Reported on 12/07/2021 07/20/17   Nyoka CowdenWert, Michael B, MD   ?hyoscyamine (LEVSIN, ANASPAZ) 0.125 MG tablet Take 0.125 mg by mouth every 6 (six) hours as needed.    [provider]  ?Lisinopril 1 MG/ML SOLN 1 mg.    [provider]  ?loperamide (IMODIUM A-D) 2 MG tablet Take 2 mg by mouth.    [provider]  ?pantoprazole (PROTONIX) 40 MG tablet Take 1 tablet (40 mg total) by mouth daily. Take 30-60 min before first meal of the day 07/20/17   Nyoka CowdenWert, Michael B, MD  ?Probiotic Product (PROBIOTIC DAILY PO) Take 1 tablet by mouth daily. Pt takes Engineer, building services[employer]    [provider]  ?Respiratory Therapy Supplies (FLUTTER) DEVI Use as directed 08/23/14   Nyoka CowdenWert, Michael B, MD  ?traMADol (ULTRAM) 50 MG tablet Take 1 tablet (50 mg total) by mouth every 6 (six) hours as needed for severe pain. 07/12/17   Charlestine NightLawyer, Christopher, PA-C  ? ? ?Family History ?Family History  ?Problem Relation Age of Onset  ? Heart disease Maternal Grandfather   ? Kidney cancer Maternal Grandfather   ? Asthma Daughter   ? Breast cancer Paternal Aunt   ? Brain cancer Paternal Grandmother   ? ? ?Social History ?Social History  ? ?Tobacco Use  ? Smoking status: Never  ? Smokeless tobacco: Never  ?Substance Use Topics  ? Alcohol use: No  ?  Alcohol/week: 0.0 standard  drinks  ? Drug use: No  ? ? ? ?Allergies   ?Vicodin [hydrocodone-acetaminophen] ? ? ?Review of Systems ?Review of Systems ? ? ?Physical Exam ?Triage Vital Signs ?ED Triage Vitals  ?Enc Vitals Group  ?   BP 12/07/21 1017 135/88  ?   Pulse Rate 12/07/21 1017 97  ?   Resp 12/07/21 1017 12  ?   Temp 12/07/21 1017 98 ?F (36.7 ?C)  ?   Temp src --   ?   SpO2 12/07/21 1017 98 %  ?   Weight --   ?   Height --   ?   Head Circumference --   ?   Peak Flow --   ?   Pain Score 12/07/21 1013 7  ?   Pain Loc --   ?   Pain Edu? --   ?   Excl. in GC? --   ? ?No data found. ? ?Updated Vital Signs ?BP 135/88   Pulse 97   Temp 98 ?F (36.7 ?C)   Resp 12   SpO2 98%  ? ?Visual Acuity ?Right Eye Distance:   ?Left Eye Distance:   ?Bilateral  Distance:   ? ?Right Eye Near:   ?Left Eye Near:    ?Bilateral Near:    ? ?Physical Exam ?Vitals reviewed.  ?Constitutional:   ?   General: She is not in acute distress. ?   Appearance: She is not toxic-appearing.  ?HENT:  ?   Right Ear: Tympanic membrane and ear canal normal.  ?   Left Ear: Tympanic membrane and ear canal normal.  ?   Nose: Nose normal.  ?   Mouth/Throat:  ?   Mouth: Mucous membranes are moist.  ?   Comments: Mucus draining oropharynx.  There is very mild erythema of the tonsillar pillars ?Eyes:  ?   Extraocular Movements: Extraocular movements intact.  ?   Conjunctiva/sclera: Conjunctivae normal.  ?   Pupils: Pupils are equal, round, and reactive to light.  ?Cardiovascular:  ?   Rate and Rhythm: Normal rate and regular rhythm.  ?   Heart sounds: No murmur heard. ?Pulmonary:  ?   Effort: Pulmonary effort is normal. No respiratory distress.  ?   Breath sounds: No wheezing, rhonchi or rales.  ?Musculoskeletal:  ?   Cervical back: Neck supple.  ?Lymphadenopathy:  ?   Cervical: No cervical adenopathy.  ?Skin: ?   Capillary Refill: Capillary refill takes less than 2 seconds.  ?   Coloration: Skin is not jaundiced or pale.  ?Neurological:  ?   General: No focal deficit present.  ?   Mental Status: She is alert and oriented to person, place, and time.  ?Psychiatric:     ?   Behavior: Behavior normal.  ? ? ? ?UC Treatments / Results  ?Labs ?(all labs ordered are listed, but only abnormal results are displayed) ?Labs Reviewed  ?CULTURE, GROUP A STREP [facility])  ?POCT RAPID STREP A (OFFICE)  ?POCT MONO SCREEN [facility])  ? ? ?EKG ? ? ?Radiology ?No results found. ? ?Procedures ?Procedures (including critical care time) ? ?Medications Ordered in UC ?Medications  ?ketorolac (TORADOL) 30 MG/ML injection 30 mg (has no administration in time range)  ? ? ?Initial Impression / Assessment and Plan / UC Course  ?I have reviewed the triage vital signs and the nursing notes. ? ?Pertinent labs & imaging results that were  available during my care of the patient were reviewed by me and considered in my medical decision  making (see chart for details). ? ?  ? ?Rapid strep is negative, and monospot neg also. We will send the culture and treat if positive.  ?Final Clinical Impressions(s) / UC Diagnoses  ? ?Final diagnoses:  ?Sore throat  ? ? ? ?Discharge Instructions   ? ?  ?Your strep test is negative.  Culture of the throat will be sent, and staff will notify you if that is in turn positive.  ? ?Monotest was also negative. ? ?You have been given a shot of Toradol 30 mg today.  ? ?Take ibuprofen 800 mg--1 tab every 8 hours as needed for pain.  ? ? ? ? ?ED Prescriptions   ? ? Medication Sig Dispense Auth. Provider  ? ibuprofen (ADVIL) 800 MG tablet Take 1 tablet (800 mg total) by mouth every 8 (eight) hours as needed (pain). 21 tablet Marlinda Mike Janace Aris, MD  ? ?  ? ?PDMP not reviewed this encounter. ?  ?Zenia Resides, MD ?12/07/21 1059 ? ?

## 2021-12-07 NOTE — Discharge Instructions (Addendum)
Your strep test is negative.  Culture of the throat will be sent, and staff will notify you if that is in turn positive.  ? ?Monotest was also negative. ? ?You have been given a shot of Toradol 30 mg today.  ? ?Take ibuprofen 800 mg--1 tab every 8 hours as needed for pain.  ?

## 2021-12-07 NOTE — ED Triage Notes (Signed)
Patient presents to Urgent Care with complaints of sore throat, fever, since 3 days ago. Patient reports Advil over the counter.  ? ?

## 2021-12-10 LAB — CULTURE, GROUP A STREP (THRC)

## 2023-02-25 ENCOUNTER — Ambulatory Visit (INDEPENDENT_AMBULATORY_CARE_PROVIDER_SITE_OTHER): Payer: No Typology Code available for payment source | Admitting: Podiatry

## 2023-02-25 ENCOUNTER — Encounter: Payer: Self-pay | Admitting: Podiatry

## 2023-02-25 ENCOUNTER — Ambulatory Visit (INDEPENDENT_AMBULATORY_CARE_PROVIDER_SITE_OTHER): Payer: No Typology Code available for payment source

## 2023-02-25 DIAGNOSIS — M722 Plantar fascial fibromatosis: Secondary | ICD-10-CM | POA: Diagnosis not present

## 2023-02-25 MED ORDER — TRIAMCINOLONE ACETONIDE 10 MG/ML IJ SUSP
10.0000 mg | Freq: Once | INTRAMUSCULAR | Status: AC
  Administered 2023-02-25: 10 mg via INTRA_ARTICULAR

## 2023-02-25 MED ORDER — DICLOFENAC SODIUM 75 MG PO TBEC: 75.0000 mg | DELAYED_RELEASE_TABLET | Freq: Two times a day (BID) | ORAL | 2 refills | Status: AC

## 2023-02-25 NOTE — Patient Instructions (Signed)

## 2023-02-25 NOTE — Progress Notes (Signed)
Subjective:   Patient ID: Susan Brock, female   DOB: 42 y.o.   MRN: [id number]   HPI Patient states that she has gotten a lot of pain in the outside of the right heel does not remember specific injury was on an anti-inflammatory which helped mildly and did have this problem 6 years ago.  Patient does not smoke likes to be active   Review of Systems  All other systems reviewed and are negative.       Objective:  Physical Exam Vitals and nursing note reviewed.  Constitutional:      Appearance: She is well-developed.  Pulmonary:     Effort: Pulmonary effort is normal.  Musculoskeletal:        General: Normal range of motion.  Skin:    General: Skin is warm.  Neurological:     Mental Status: She is alert.     Neurovascular status intact muscle strength found to be adequate range of motion adequate with exquisite discomfort lateral side plantar fascial band with inflammation fluid buildup into the center band.  Good digital perfusion well-oriented     Assessment:  Acute Planter fasciitis right appears to be more within the lateral band     Plan:  H&P x-ray reviewed sterile prep injected the fascia lateral 3 mg Kenalog 5 mg Xylocaine advised on supportive therapy  X-rays do not indicate spur no indication stress fracture arthritis
